# Patient Record
Sex: Male | Born: 1965 | Race: Black or African American | Hispanic: No | Smoking: Current every day smoker
Health system: Southern US, Community
[De-identification: ages and names within clinical notes are randomized; demographics above are authoritative.]

## PROBLEM LIST (undated history)

## (undated) DIAGNOSIS — R251 Tremor, unspecified: Secondary | ICD-10-CM

## (undated) DIAGNOSIS — R0600 Dyspnea, unspecified: Secondary | ICD-10-CM

## (undated) DIAGNOSIS — R569 Unspecified convulsions: Secondary | ICD-10-CM

## (undated) DIAGNOSIS — Q2730 Arteriovenous malformation, site unspecified: Secondary | ICD-10-CM

## (undated) DIAGNOSIS — I10 Essential (primary) hypertension: Secondary | ICD-10-CM

## (undated) DIAGNOSIS — R188 Other ascites: Secondary | ICD-10-CM

## (undated) DIAGNOSIS — K219 Gastro-esophageal reflux disease without esophagitis: Secondary | ICD-10-CM

## (undated) DIAGNOSIS — M21371 Foot drop, right foot: Secondary | ICD-10-CM

## (undated) DIAGNOSIS — M1990 Unspecified osteoarthritis, unspecified site: Secondary | ICD-10-CM

## (undated) DIAGNOSIS — J449 Chronic obstructive pulmonary disease, unspecified: Secondary | ICD-10-CM

## (undated) DIAGNOSIS — Z9359 Other cystostomy status: Secondary | ICD-10-CM

## (undated) HISTORY — PX: SUPRAPUBIC CATHETER INSERTION: SUR719

## (undated) HISTORY — PX: BRAIN SURGERY: SHX531

## (undated) HISTORY — PX: HERNIA REPAIR: SHX51

## (undated) HISTORY — PX: LEG SURGERY: SHX1003

---

## 2014-11-05 ENCOUNTER — Ambulatory Visit: Payer: Self-pay | Admitting: Oncology

## 2014-11-14 ENCOUNTER — Inpatient Hospital Stay: Payer: Self-pay | Admitting: Internal Medicine

## 2014-11-14 LAB — COMPREHENSIVE METABOLIC PANEL
ALBUMIN: 4.3 g/dL (ref 3.4–5.0)
ALK PHOS: 111 U/L
ANION GAP: 14 (ref 7–16)
Albumin: 4.2 g/dL (ref 3.4–5.0)
Alkaline Phosphatase: 108 U/L
Anion Gap: 18 — ABNORMAL HIGH (ref 7–16)
BUN: 112 mg/dL — AB (ref 7–18)
BUN: 118 mg/dL — ABNORMAL HIGH (ref 7–18)
Bilirubin,Total: 1 mg/dL (ref 0.2–1.0)
Bilirubin,Total: 0.8 mg/dL (ref 0.2–1.0)
CALCIUM: 9.9 mg/dL (ref 8.5–10.1)
CHLORIDE: 92 mmol/L — AB (ref 98–107)
CO2: 21 mmol/L (ref 21–32)
CREATININE: 13.62 mg/dL — AB (ref 0.60–1.30)
Calcium, Total: 8.8 mg/dL (ref 8.5–10.1)
Chloride: 91 mmol/L — ABNORMAL LOW (ref 98–107)
Co2: 14 mmol/L — ABNORMAL LOW (ref 21–32)
Creatinine: 14.61 mg/dL — ABNORMAL HIGH (ref 0.60–1.30)
EGFR (African American): 5 — ABNORMAL LOW
EGFR (African American): 5 — ABNORMAL LOW
EGFR (Non-African Amer.): 4 — ABNORMAL LOW
EGFR (Non-African Amer.): 4 — ABNORMAL LOW
Glucose: 122 mg/dL — ABNORMAL HIGH (ref 65–99)
Glucose: 95 mg/dL (ref 65–99)
OSMOLALITY: 291 (ref 275–301)
Osmolality: 286 (ref 275–301)
POTASSIUM: 5.8 mmol/L — AB (ref 3.5–5.1)
Potassium: 6.3 mmol/L — ABNORMAL HIGH (ref 3.5–5.1)
SGOT(AST): 24 U/L (ref 15–37)
SGOT(AST): 24 U/L (ref 15–37)
SGPT (ALT): 31 U/L
SGPT (ALT): 28 U/L
SODIUM: 126 mmol/L — AB (ref 136–145)
Sodium: 124 mmol/L — ABNORMAL LOW (ref 136–145)
Total Protein: 9.6 g/dL — ABNORMAL HIGH (ref 6.4–8.2)
Total Protein: 9.7 g/dL — ABNORMAL HIGH (ref 6.4–8.2)

## 2014-11-14 LAB — URINALYSIS, COMPLETE
BACTERIA: NONE SEEN
BLOOD: NEGATIVE
Bilirubin,UR: NEGATIVE
GLUCOSE, UR: NEGATIVE mg/dL (ref 0–75)
Ketone: NEGATIVE
LEUKOCYTE ESTERASE: NEGATIVE
NITRITE: NEGATIVE
PROTEIN: NEGATIVE
Ph: 5 (ref 4.5–8.0)
RBC,UR: NONE SEEN /HPF (ref 0–5)
SPECIFIC GRAVITY: 1.017 (ref 1.003–1.030)
Squamous Epithelial: NONE SEEN
WBC UR: <1 /HPF (ref 0–5)

## 2014-11-14 LAB — PHOSPHORUS: PHOSPHORUS: 10.5 mg/dL — AB (ref 2.5–4.9)

## 2014-11-14 LAB — BODY FLUID CELL COUNT WITH DIFFERENTIAL
BASOS ABS: 0 %
EOS PCT: 0 %
LYMPHS PCT: 4 %
NEUTROS PCT: 3 %
Nucleated Cell Count: 128 /mm3
Other Cells BF: 0 %
Other Mononuclear Cells: 93 %

## 2014-11-14 LAB — APTT: Activated PTT: 23.6 secs (ref 23.6–35.9)

## 2014-11-14 LAB — LIPASE, BLOOD: LIPASE: 185 U/L (ref 73–393)

## 2014-11-14 LAB — CBC
HCT: 52.7 % — ABNORMAL HIGH (ref 40.0–52.0)
HGB: 17.3 g/dL (ref 13.0–18.0)
MCH: 30 pg (ref 26.0–34.0)
MCHC: 32.8 g/dL (ref 32.0–36.0)
MCV: 91 fL (ref 80–100)
Platelet: 308 10*3/uL (ref 150–440)
RBC: 5.77 10*6/uL (ref 4.40–5.90)
RDW: 14.4 % (ref 11.5–14.5)
WBC: 9.2 10*3/uL (ref 3.8–10.6)

## 2014-11-14 LAB — PROTIME-INR
INR: 1.2
Prothrombin Time: 14.7 secs (ref 11.5–14.7)

## 2014-11-14 LAB — PROTEIN / CREATININE RATIO, URINE
CREATININE, URINE: 235.7 mg/dL — AB (ref 30.0–125.0)
Protein, Random Urine: 11 mg/dL (ref 0–12)
Protein/Creat. Ratio: 47 mg/gCREAT (ref 0–200)

## 2014-11-14 LAB — TROPONIN I: Troponin-I: <0.02 ng/mL

## 2014-11-14 LAB — ALBUMIN, FLUID (OTHER)

## 2014-11-14 LAB — AMMONIA: AMMONIA, PLASMA: 35 umol/L — AB (ref 11–32)

## 2014-11-15 DIAGNOSIS — I517 Cardiomegaly: Secondary | ICD-10-CM

## 2014-11-15 LAB — CBC WITH DIFFERENTIAL/PLATELET
BASOS PCT: 0.1 %
Basophil #: 0 10*3/uL (ref 0.0–0.1)
Eosinophil #: 0 10*3/uL (ref 0.0–0.7)
Eosinophil %: 0.4 %
HCT: 41.4 % (ref 40.0–52.0)
HGB: 14.1 g/dL (ref 13.0–18.0)
Lymphocyte #: 0.3 10*3/uL — ABNORMAL LOW (ref 1.0–3.6)
Lymphocyte %: 3.6 %
MCH: 30.2 pg (ref 26.0–34.0)
MCHC: 34.1 g/dL (ref 32.0–36.0)
MCV: 89 fL (ref 80–100)
Monocyte #: 0.8 x10 3/mm (ref 0.2–1.0)
Monocyte %: 9.8 %
NEUTROS ABS: 6.7 10*3/uL — AB (ref 1.4–6.5)
Neutrophil %: 86.1 %
Platelet: 185 10*3/uL (ref 150–440)
RBC: 4.66 10*6/uL (ref 4.40–5.90)
RDW: 14.3 % (ref 11.5–14.5)
WBC: 7.8 10*3/uL (ref 3.8–10.6)

## 2014-11-15 LAB — TSH: Thyroid Stimulating Horm: 0.85 u[IU]/mL

## 2014-11-15 LAB — COMPREHENSIVE METABOLIC PANEL
ALK PHOS: 78 U/L
AST: 19 U/L (ref 15–37)
Albumin: 3.5 g/dL (ref 3.4–5.0)
Anion Gap: 7 (ref 7–16)
BILIRUBIN TOTAL: 0.5 mg/dL (ref 0.2–1.0)
BUN: 60 mg/dL — AB (ref 7–18)
CO2: 29 mmol/L (ref 21–32)
Calcium, Total: 8.5 mg/dL (ref 8.5–10.1)
Chloride: 96 mmol/L — ABNORMAL LOW (ref 98–107)
Creatinine: 3.81 mg/dL — ABNORMAL HIGH (ref 0.60–1.30)
EGFR (Non-African Amer.): 18 — ABNORMAL LOW
GFR CALC AF AMER: 22 — AB
Glucose: 103 mg/dL — ABNORMAL HIGH (ref 65–99)
Osmolality: 282 (ref 275–301)
Potassium: 4 mmol/L (ref 3.5–5.1)
SGPT (ALT): 20 U/L
Sodium: 132 mmol/L — ABNORMAL LOW (ref 136–145)
Total Protein: 7.5 g/dL (ref 6.4–8.2)

## 2014-11-15 LAB — LIPASE, BLOOD: Lipase: 123 U/L (ref 73–393)

## 2014-11-15 LAB — HEMOGLOBIN A1C: HEMOGLOBIN A1C: 5.4 % (ref 4.2–6.3)

## 2014-11-16 LAB — COMPREHENSIVE METABOLIC PANEL
ALK PHOS: 65 U/L
ANION GAP: 5 — AB (ref 7–16)
AST: 24 U/L (ref 15–37)
Albumin: 2.7 g/dL — ABNORMAL LOW (ref 3.4–5.0)
Albumin: 2.8 g/dL — ABNORMAL LOW (ref 3.4–5.0)
Alkaline Phosphatase: 60 U/L
Anion Gap: 5 — ABNORMAL LOW (ref 7–16)
BILIRUBIN TOTAL: 0.4 mg/dL (ref 0.2–1.0)
BUN: 16 mg/dL (ref 7–18)
BUN: 10 mg/dL (ref 7–18)
Bilirubin,Total: 0.4 mg/dL (ref 0.2–1.0)
CALCIUM: 8.2 mg/dL — AB (ref 8.5–10.1)
Calcium, Total: 8.1 mg/dL — ABNORMAL LOW (ref 8.5–10.1)
Chloride: 100 mmol/L (ref 98–107)
Chloride: 101 mmol/L (ref 98–107)
Co2: 32 mmol/L (ref 21–32)
Co2: 31 mmol/L (ref 21–32)
Creatinine: 0.93 mg/dL (ref 0.60–1.30)
Creatinine: 0.84 mg/dL (ref 0.60–1.30)
EGFR (African American): >60
EGFR (African American): >60
GLUCOSE: 91 mg/dL (ref 65–99)
Glucose: 99 mg/dL (ref 65–99)
OSMOLALITY: 275 (ref 275–301)
OSMOLALITY: 273 (ref 275–301)
Potassium: 4.1 mmol/L (ref 3.5–5.1)
Potassium: 4.5 mmol/L (ref 3.5–5.1)
SGOT(AST): 25 U/L (ref 15–37)
SGPT (ALT): 19 U/L
SGPT (ALT): 18 U/L
SODIUM: 137 mmol/L (ref 136–145)
SODIUM: 137 mmol/L (ref 136–145)
Total Protein: 5.9 g/dL — ABNORMAL LOW (ref 6.4–8.2)
Total Protein: 6.2 g/dL — ABNORMAL LOW (ref 6.4–8.2)

## 2014-11-16 LAB — CBC WITH DIFFERENTIAL/PLATELET
BASOS ABS: 0 10*3/uL (ref 0.0–0.1)
BASOS PCT: 0.4 %
Eosinophil #: 0 10*3/uL (ref 0.0–0.7)
Eosinophil %: 0.7 %
HCT: 34.8 % — ABNORMAL LOW (ref 40.0–52.0)
HGB: 11.4 g/dL — AB (ref 13.0–18.0)
LYMPHS PCT: 13.7 %
Lymphocyte #: 1 10*3/uL (ref 1.0–3.6)
MCH: 30.1 pg (ref 26.0–34.0)
MCHC: 32.9 g/dL (ref 32.0–36.0)
MCV: 91 fL (ref 80–100)
MONO ABS: 1.3 x10 3/mm — AB (ref 0.2–1.0)
Monocyte %: 17.8 %
Neutrophil #: 5 10*3/uL (ref 1.4–6.5)
Neutrophil %: 67.4 %
PLATELETS: 154 10*3/uL (ref 150–440)
RBC: 3.81 10*6/uL — ABNORMAL LOW (ref 4.40–5.90)
RDW: 13.9 % (ref 11.5–14.5)
WBC: 7.4 10*3/uL (ref 3.8–10.6)

## 2014-11-16 LAB — AFP TUMOR MARKER: AFP-Tumor Marker: 4.9 ng/mL (ref 0.0–8.3)

## 2014-11-16 LAB — AMMONIA: Ammonia, Plasma: 32 mcmol/L (ref 11–32)

## 2014-11-16 LAB — UR PROT ELECTROPHORESIS, URINE RANDOM

## 2014-11-17 LAB — CEA: CEA: 1.1 ng/mL (ref 0.0–4.7)

## 2014-11-17 LAB — CANCER ANTIGEN 19-9: CA 19 9: 4 U/mL (ref 0–35)

## 2014-11-18 LAB — CBC WITH DIFFERENTIAL/PLATELET
BASOS ABS: 0 10*3/uL (ref 0.0–0.1)
Basophil %: 0.1 %
Eosinophil #: 0.3 10*3/uL (ref 0.0–0.7)
Eosinophil %: 2 %
HCT: 39.2 % — AB (ref 40.0–52.0)
HGB: 12.8 g/dL — ABNORMAL LOW (ref 13.0–18.0)
LYMPHS ABS: 1.5 10*3/uL (ref 1.0–3.6)
LYMPHS PCT: 11.1 %
MCH: 29.8 pg (ref 26.0–34.0)
MCHC: 32.6 g/dL (ref 32.0–36.0)
MCV: 92 fL (ref 80–100)
MONOS PCT: 6.1 %
Monocyte #: 0.8 x10 3/mm (ref 0.2–1.0)
Neutrophil #: 11.1 10*3/uL — ABNORMAL HIGH (ref 1.4–6.5)
Neutrophil %: 80.7 %
PLATELETS: 227 10*3/uL (ref 150–440)
RBC: 4.28 10*6/uL — ABNORMAL LOW (ref 4.40–5.90)
RDW: 14.2 % (ref 11.5–14.5)
WBC: 13.8 10*3/uL — AB (ref 3.8–10.6)

## 2014-11-18 LAB — BASIC METABOLIC PANEL
Anion Gap: 7 (ref 7–16)
BUN: 30 mg/dL — ABNORMAL HIGH (ref 7–18)
CALCIUM: 8.9 mg/dL (ref 8.5–10.1)
Chloride: 99 mmol/L (ref 98–107)
Co2: 27 mmol/L (ref 21–32)
Creatinine: 2.53 mg/dL — ABNORMAL HIGH (ref 0.60–1.30)
EGFR (African American): 35 — ABNORMAL LOW
GFR CALC NON AF AMER: 29 — AB
GLUCOSE: 82 mg/dL (ref 65–99)
OSMOLALITY: 272 (ref 275–301)
POTASSIUM: 4.4 mmol/L (ref 3.5–5.1)
SODIUM: 133 mmol/L — AB (ref 136–145)

## 2014-11-18 LAB — KAPPA/LAMBDA FREE LIGHT CHAINS (ARMC)

## 2014-11-18 LAB — PROTEIN ELECTROPHORESIS(ARMC)

## 2014-11-18 LAB — BODY FLUID CULTURE

## 2014-11-22 ENCOUNTER — Inpatient Hospital Stay: Payer: Self-pay | Admitting: Internal Medicine

## 2014-11-22 LAB — URINALYSIS, COMPLETE
BILIRUBIN, UR: NEGATIVE
Glucose,UR: >=500 mg/dL (ref 0–75)
KETONE: NEGATIVE
Nitrite: NEGATIVE
PH: 6 (ref 4.5–8.0)
Protein: 100
RBC,UR: 13 /HPF (ref 0–5)
SQUAMOUS EPITHELIAL: NONE SEEN
Specific Gravity: 1.011 (ref 1.003–1.030)
WBC UR: 107 /HPF (ref 0–5)

## 2014-11-22 LAB — BODY FLUID CELL COUNT WITH DIFFERENTIAL
Basophil: 0 %
Eosinophil: 0 %
Lymphocytes: 1 %
NUCLEATED CELL COUNT: 995 /mm3
Neutrophils: 55 %
OTHER CELLS BF: 0 %
OTHER MONONUCLEAR CELLS: 44 %

## 2014-11-22 LAB — TROPONIN I

## 2014-11-22 LAB — DRUG SCREEN, URINE

## 2014-11-22 LAB — PROTEIN, BODY FLUID: PROTEIN, BODY FLUID: 0.6 g/dL

## 2014-11-22 LAB — CBC WITH DIFFERENTIAL/PLATELET
BASOS PCT: 0.3 %
Basophil #: 0 10*3/uL (ref 0.0–0.1)
EOS PCT: 0.1 %
Eosinophil #: 0 10*3/uL (ref 0.0–0.7)
HCT: 45.9 % (ref 40.0–52.0)
HGB: 15.4 g/dL (ref 13.0–18.0)
LYMPHS PCT: 3.3 %
Lymphocyte #: 0.5 10*3/uL — ABNORMAL LOW (ref 1.0–3.6)
MCH: 30.5 pg (ref 26.0–34.0)
MCHC: 33.5 g/dL (ref 32.0–36.0)
MCV: 91 fL (ref 80–100)
Monocyte #: 1.2 x10 3/mm — ABNORMAL HIGH (ref 0.2–1.0)
Monocyte %: 7.9 %
NEUTROS ABS: 13 10*3/uL — AB (ref 1.4–6.5)
NEUTROS PCT: 88.4 %
Platelet: 356 10*3/uL (ref 150–440)
RBC: 5.05 10*6/uL (ref 4.40–5.90)
RDW: 14.4 % (ref 11.5–14.5)
WBC: 14.7 10*3/uL — ABNORMAL HIGH (ref 3.8–10.6)

## 2014-11-22 LAB — LIPASE, BLOOD: Lipase: 225 U/L (ref 73–393)

## 2014-11-22 LAB — COMPREHENSIVE METABOLIC PANEL
ALK PHOS: 101 U/L
Albumin: 3.7 g/dL (ref 3.4–5.0)
Anion Gap: 16 (ref 7–16)
BILIRUBIN TOTAL: 0.6 mg/dL (ref 0.2–1.0)
BUN: 117 mg/dL — ABNORMAL HIGH (ref 7–18)
CALCIUM: 10.1 mg/dL (ref 8.5–10.1)
Chloride: 88 mmol/L — ABNORMAL LOW (ref 98–107)
Co2: 18 mmol/L — ABNORMAL LOW (ref 21–32)
Creatinine: 10.72 mg/dL — ABNORMAL HIGH (ref 0.60–1.30)
EGFR (African American): 7 — ABNORMAL LOW
GFR CALC NON AF AMER: 5 — AB
Glucose: 137 mg/dL — ABNORMAL HIGH (ref 65–99)
Osmolality: 285 (ref 275–301)
POTASSIUM: 5.9 mmol/L — AB (ref 3.5–5.1)
SGOT(AST): 21 U/L (ref 15–37)
SGPT (ALT): 22 U/L
SODIUM: 122 mmol/L — AB (ref 136–145)
Total Protein: 9.7 g/dL — ABNORMAL HIGH (ref 6.4–8.2)

## 2014-11-22 LAB — ALBUMIN, FLUID (OTHER)

## 2014-11-22 LAB — LACTATE DEHYDROGENASE, PLEURAL OR PERITONEAL FLUID: LDH, BODY FLUID: 68 U/L

## 2014-11-22 LAB — HEMOGLOBIN: HGB: 13.1 g/dL (ref 13.0–18.0)

## 2014-11-23 LAB — BASIC METABOLIC PANEL
Anion Gap: 10 (ref 7–16)
BUN: 60 mg/dL — AB (ref 7–18)
CREATININE: 3.78 mg/dL — AB (ref 0.60–1.30)
Calcium, Total: 8.7 mg/dL (ref 8.5–10.1)
Chloride: 97 mmol/L — ABNORMAL LOW (ref 98–107)
Co2: 24 mmol/L (ref 21–32)
EGFR (Non-African Amer.): 18 — ABNORMAL LOW
GFR CALC AF AMER: 22 — AB
Glucose: 123 mg/dL — ABNORMAL HIGH (ref 65–99)
Osmolality: 281 (ref 275–301)
Potassium: 4.7 mmol/L (ref 3.5–5.1)
SODIUM: 131 mmol/L — AB (ref 136–145)

## 2014-11-23 LAB — CBC WITH DIFFERENTIAL/PLATELET
BASOS ABS: 0 10*3/uL (ref 0.0–0.1)
Basophil %: 0.2 %
Eosinophil #: 0.1 10*3/uL (ref 0.0–0.7)
Eosinophil %: 0.7 %
HCT: 37.4 % — AB (ref 40.0–52.0)
HGB: 12.4 g/dL — AB (ref 13.0–18.0)
LYMPHS ABS: 0.6 10*3/uL — AB (ref 1.0–3.6)
Lymphocyte %: 8.2 %
MCH: 30.3 pg (ref 26.0–34.0)
MCHC: 33.2 g/dL (ref 32.0–36.0)
MCV: 91 fL (ref 80–100)
MONO ABS: 0.7 x10 3/mm (ref 0.2–1.0)
Monocyte %: 9.6 %
NEUTROS ABS: 6.2 10*3/uL (ref 1.4–6.5)
Neutrophil %: 81.3 %
Platelet: 318 10*3/uL (ref 150–440)
RBC: 4.1 10*6/uL — ABNORMAL LOW (ref 4.40–5.90)
RDW: 14.1 % (ref 11.5–14.5)
WBC: 7.7 10*3/uL (ref 3.8–10.6)

## 2014-11-23 LAB — COMPREHENSIVE METABOLIC PANEL
ALBUMIN: 2.5 g/dL — AB (ref 3.4–5.0)
ANION GAP: 7 (ref 7–16)
AST: 19 U/L (ref 15–37)
Alkaline Phosphatase: 70 U/L
BUN: 38 mg/dL — ABNORMAL HIGH (ref 7–18)
Bilirubin,Total: 0.7 mg/dL (ref 0.2–1.0)
Calcium, Total: 8.5 mg/dL (ref 8.5–10.1)
Chloride: 99 mmol/L (ref 98–107)
Co2: 25 mmol/L (ref 21–32)
Creatinine: 2.19 mg/dL — ABNORMAL HIGH (ref 0.60–1.30)
EGFR (African American): 41 — ABNORMAL LOW
EGFR (Non-African Amer.): 34 — ABNORMAL LOW
Glucose: 133 mg/dL — ABNORMAL HIGH (ref 65–99)
Osmolality: 274 (ref 275–301)
POTASSIUM: 4.3 mmol/L (ref 3.5–5.1)
SGPT (ALT): 16 U/L
Sodium: 131 mmol/L — ABNORMAL LOW (ref 136–145)
Total Protein: 7 g/dL (ref 6.4–8.2)

## 2014-11-23 LAB — AMMONIA: Ammonia, Plasma: 20 mcmol/L (ref 11–32)

## 2014-11-23 LAB — MAGNESIUM: MAGNESIUM: 2.1 mg/dL

## 2014-11-23 LAB — PROTIME-INR
INR: 1.2
Prothrombin Time: 14.9 secs — ABNORMAL HIGH (ref 11.5–14.7)

## 2014-11-23 LAB — APTT: Activated PTT: 35 secs (ref 23.6–35.9)

## 2014-11-24 LAB — BASIC METABOLIC PANEL
Anion Gap: 5 — ABNORMAL LOW (ref 7–16)
BUN: 6 mg/dL — ABNORMAL LOW (ref 7–18)
CHLORIDE: 101 mmol/L (ref 98–107)
Calcium, Total: 8.4 mg/dL — ABNORMAL LOW (ref 8.5–10.1)
Co2: 30 mmol/L (ref 21–32)
Creatinine: 0.8 mg/dL (ref 0.60–1.30)
EGFR (African American): >60
EGFR (Non-African Amer.): >60
Glucose: 76 mg/dL (ref 65–99)
OSMOLALITY: 268 (ref 275–301)
POTASSIUM: 3.6 mmol/L (ref 3.5–5.1)
Sodium: 136 mmol/L (ref 136–145)

## 2014-11-24 LAB — CBC WITH DIFFERENTIAL/PLATELET
BASOS PCT: 1 %
Basophil #: 0 10*3/uL (ref 0.0–0.1)
EOS PCT: 3.3 %
Eosinophil #: 0.1 10*3/uL (ref 0.0–0.7)
HCT: 32 % — AB (ref 40.0–52.0)
HGB: 10.6 g/dL — AB (ref 13.0–18.0)
LYMPHS ABS: 0.6 10*3/uL — AB (ref 1.0–3.6)
Lymphocyte %: 17.4 %
MCH: 30.3 pg (ref 26.0–34.0)
MCHC: 33.1 g/dL (ref 32.0–36.0)
MCV: 92 fL (ref 80–100)
MONO ABS: 0.5 x10 3/mm (ref 0.2–1.0)
Monocyte %: 16 %
NEUTROS ABS: 2.1 10*3/uL (ref 1.4–6.5)
Neutrophil %: 62.3 %
Platelet: 257 10*3/uL (ref 150–440)
RBC: 3.5 10*6/uL — ABNORMAL LOW (ref 4.40–5.90)
RDW: 13.9 % (ref 11.5–14.5)
WBC: 3.3 10*3/uL — ABNORMAL LOW (ref 3.8–10.6)

## 2014-11-26 LAB — BODY FLUID CULTURE

## 2014-11-27 ENCOUNTER — Emergency Department: Payer: Self-pay | Admitting: Emergency Medicine

## 2014-11-27 LAB — COMPREHENSIVE METABOLIC PANEL
ALBUMIN: 3.8 g/dL (ref 3.4–5.0)
ALK PHOS: 79 U/L
ALT: 21 U/L
ANION GAP: 8 (ref 7–16)
BUN: 3 mg/dL — ABNORMAL LOW (ref 7–18)
Bilirubin,Total: 0.2 mg/dL (ref 0.2–1.0)
CALCIUM: 9.1 mg/dL (ref 8.5–10.1)
Chloride: 103 mmol/L (ref 98–107)
Co2: 27 mmol/L (ref 21–32)
Creatinine: 0.95 mg/dL (ref 0.60–1.30)
EGFR (African American): >60
EGFR (Non-African Amer.): >60
Glucose: 97 mg/dL (ref 65–99)
Osmolality: 272 (ref 275–301)
Potassium: 4.3 mmol/L (ref 3.5–5.1)
SGOT(AST): 21 U/L (ref 15–37)
Sodium: 138 mmol/L (ref 136–145)
TOTAL PROTEIN: 8 g/dL (ref 6.4–8.2)

## 2014-11-27 LAB — DRUG SCREEN, URINE

## 2014-11-27 LAB — CBC
HCT: 40.9 % (ref 40.0–52.0)
HGB: 13.2 g/dL (ref 13.0–18.0)
MCH: 29.6 pg (ref 26.0–34.0)
MCHC: 32.3 g/dL (ref 32.0–36.0)
MCV: 92 fL (ref 80–100)
Platelet: 316 10*3/uL (ref 150–440)
RBC: 4.47 10*6/uL (ref 4.40–5.90)
RDW: 14 % (ref 11.5–14.5)
WBC: 6.2 10*3/uL (ref 3.8–10.6)

## 2014-11-27 LAB — ETHANOL: Ethanol: <3 mg/dL

## 2014-11-27 LAB — ACETAMINOPHEN LEVEL

## 2014-11-27 LAB — SALICYLATE LEVEL: Salicylates, Serum: <1.7 mg/dL

## 2014-11-27 LAB — TSH: Thyroid Stimulating Horm: 1.39 u[IU]/mL

## 2014-12-06 ENCOUNTER — Ambulatory Visit: Payer: Self-pay | Admitting: Oncology

## 2014-12-17 ENCOUNTER — Emergency Department: Payer: Self-pay | Admitting: Emergency Medicine

## 2014-12-23 ENCOUNTER — Ambulatory Visit: Payer: Self-pay | Admitting: Urology

## 2014-12-28 ENCOUNTER — Ambulatory Visit: Payer: Self-pay | Admitting: Urology

## 2015-01-14 ENCOUNTER — Emergency Department: Payer: Self-pay | Admitting: Emergency Medicine

## 2015-03-06 NOTE — Consult Note (Signed)
Pt asleep, awoke for questions, denies abd pain but is tender on palpation.  No abd distention, VSS afebrile, hgb 11, albumin 2.7.  Hep B studies neg.  Consider repeat CT, consider laproscopic exam when improved.  Pt denies moonshine consumption.  Electronic Signatures: Scot JunElliott, Renaldo Gornick T (MD)  (Signed on 12-Jan-16 12:03)  Authored  Last Updated: 12-Jan-16 12:03 by Scot JunElliott, Tametra Ahart T (MD)

## 2015-03-06 NOTE — Consult Note (Signed)
Pt extubated and talking, says he will cut back on alcohol. Does have discomfort in RUQ and epigastric area.  His albumin on admission was normal but he was also in renal failure, dehydration.  Repeat LFT's tomorrow, await other labs assoc with metastatic cancer.  Not beligerent now.  Waiting on stool studies.  Electronic Signatures: Scot JunElliott, Kateri Balch T (MD)  (Signed on 11-Jan-16 17:00)  Authored  Last Updated: 11-Jan-16 17:00 by Scot JunElliott, Adriana Lina T (MD)
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
## 2015-03-06 NOTE — H&P (Signed)
PATIENT NAME:  Joanie CoddingtonHORPE, Ricci MR#:  [id number] DATE OF BIRTH:  07/06/1966  PRIMARY CARE PROVIDER: None.   CHIEF COMPLAINT: Abdominal pain and distention.   HISTORY OF PRESENTING ILLNESS: A 49 year old African American male patient with past medical history of recurrent ascites, COPD, presents to the Emergency Room complaining of worsening abdominal pain and distention. The patient was recently in the hospital from 11/14/2014-11/18/2014 for ascites, renal failure. The patient was admitted on 11/14/2014 with abdominal pain, distention, also with encephalopathy with severe agitation, started on Precedex drip, later sedated and had to be intubated. The patient did receive 1 round of dialysis, after which he started making good amount of urine, but later he had urinary retention and refused a Foley. In the meantime, patient did get paracentesis with 6 liters of fluid removed which was  transudate. Cytology is not available at this time. He was also seen by oncology who suggested maybe getting biopsy since he had peritoneal carcinomatosis with ascites. Echocardiogram was normal.   On reviewing his ANA results, ANA was positive with anti-SSA positive.   PAST MEDICAL HISTORY: 1.  Recurrent ascites.  2.  COPD.   PAST SURGICAL HISTORY:  Brain surgery in 1998.   ALLERGIES: ADVIL.   HOME MEDICATIONS:  None.   FAMILY HISTORY: Father with diabetes and heart disease.   SOCIAL HISTORY: The patient lives alone. He smokes 1/2 pack a day. He does smoke marijuana. States only occasional alcohol usage.   REVIEW OF SYSTEMS: Unobtainable secondary to patient's encephalopathy.   PHYSICAL EXAMINATION: VITAL SIGNS:  Shows temperature 97.4, pulse of 112, blood pressure 156/129, saturating 98% on 2 liters oxygen.  GENERAL: African American male patient sitting up in bed, drowsy.  PSYCHIATRIC: Drowsy.  HEENT: Atraumatic, normocephalic. Oral mucosa dry and pink. External ears and nose normal. No pallor. No  icterus. Pupils bilaterally equal and reactive to light.  NECK: Supple. No thyromegaly. No palpable lymph nodes. Trachea midline. No carotid bruit or JVD.  CARDIOVASCULAR: S1, S2 tachycardic without any murmurs. Peripheral pulses 2+. No edema. RESPIRATORY: Has decreased air entry on the right side.   GASTROINTESTINAL: Has distended tense abdomen, tender diffusely.  GENITOURINARY:  No CVA tenderness or bladder distention.  SKIN:  Warm and dry. No petechiae, no rash, or ulcers.  MUSCULOSKELETAL:  No joint swelling, redness, effusion of the large joints. Normal muscle tone.  NEUROLOGIC: Motor strength 5/5 in upper and lower extremities.  LYMPHATIC: No cervical lymphadenopathy.   LABORATORY STUDIES: Show glucose of 137, BUN 117, creatinine 10.72, , potassium 5.9, chloride 88, bicarbonate 18, lipase 225. AST, ALT, alkaline phosphatase, bilirubin, and albumin normal. Troponin less than 0.02. WBC 14.7, hemoglobin 15.4, with platelets of 356,000.   Urinalysis shows 1+ bacteria and 107 WBC with no epithelial cells.   IMAGING:  CT scan of the abdomen and pelvis without contrast shows significant increase in ascites. Also increase in his right pleural effusion, right lower lobe consolidation.   EKG shows sinus tachycardia.   ASSESSMENT AND PLAN: 1.  Acute renal failure, likely from urinary obstruction. The patient had 900 mL of urine on bladder scan, initially refused Foley, but on further counseling and giving him Versed he has agreed. Presently, he has good urine output. I have discussed with Dr. Thedore MinsSingh of nephrology who has seen the patient. Continue intravenous fluids and monitor input and output closely. If there is any worsening he will need dialysis. The patient did have dialysis recently a week prior, only 1 round during recent admission. His potassium  should improve as his acute renal failure improves.  2.  Recurrent ascites. It is unclear what is the cause for his recurrent ascites. The patient  has had a paracentesis done previously in [place] and [place]. On his recent admission, this fluid was transudate; cytology is not available. Both gastroenterology and oncology had seen the patient. Dr. Orlie Dakin did suggest biopsy due to possibility for abdominal carcinomatosis. We will consult oncology again.  3.  Gastrointestinal bleed. The patient had 150 mL of dark red vomiting here in the Emergency Room. We will start him on Protonix drip, get serial hemoglobins, consult gastroenterology for consideration of an endoscopy. Could be esophageal varices or peptic ulcer disease.  4.  Right lower lobe consolidation, infiltrate. We will start him on antibiotics with vancomycin,  Zosyn, and azithromycin to cover for healthcare-acquired pneumonia.  5.  Positive ANA with positive anti-SSA. The patient does not seem to have any rheumatologic problems at this point, but will consult rheumatology to see if there is any further relation to his acute condition with these laboratory studies.  6.  Deep vein thrombosis prophylaxis with sequential compression devices.   CODE STATUS: Full code.   Critical care time spent today on this patient's care was 65 minutes.   ____________________________ Molinda Bailiff Rollande Thursby, MD srs:LT D: 11/22/2014 16:04:06 ET T: 11/22/2014 16:23:39 ET JOB#: [id number]  cc: Wardell Heath R. Kathya Wilz, MD, <Dictator> Orie Fisherman MD ELECTRONICALLY SIGNED 12/01/2014 16:55

## 2015-03-06 NOTE — Op Note (Signed)
PATIENT NAME:  Corey CoddingtonHORPE, Hawkins MR#:  [id number] DATE OF BIRTH:  [birth year]  DATE OF PROCEDURE:  11/14/2014  PREOPERATIVE DIAGNOSES:  1.  Acute renal failure.  2.  Poor venous access.   POSTOPERATIVE DIAGNOSES: 1.  Acute renal failure. 2.  Poor venous access.  PROCEDURES:  1.  Ultrasound guidance for vascular access, right jugular vein.  2.  Placement of right jugular triple-lumen catheter.   SURGEON: Annice NeedyJason S. Shalona Harbour, MD   ANESTHESIA: Local.   ESTIMATED BLOOD LOSS: Minimal.   INDICATION FOR PROCEDURE: A 49 year old gentleman who was admitted with acute renal failure. He is intubated and sedated on the ventilator. He has poor venous access. I have already placed a dialysis catheter, which is dictated separately, and I am asked to place a central line.   DESCRIPTION OF PROCEDURE: The patient is laid flat in the critical care bed. The right neck was sterilely prepped and draped and a sterile surgical field was created. The jugular vein was visualized and found to be widely patent. It was accessed under direct ultrasound guidance without difficulty with a Seldinger needle. A J-wire was placed after skin nick and dilatation. The triple lumen catheter was placed over the wire and the wire was removed. All 3 lumens withdrew blood, which was dark red and nonpulsatile and flushed easily with sterile saline. It was secured to the skin at about 18 cm with 3 silk sutures. Sterile dressing was placed. Stat chest x-ray showed the catheter tip at the cavoatrial junction in excellent location.    ____________________________ Annice NeedyJason S. Aldine Chakraborty, MD jsd:TT D: 11/14/2014 15:07:23 ET T: 11/14/2014 19:45:29 ET JOB#: [id number]  cc: Annice NeedyJason S. Tereso Unangst, MD, <Dictator> Annice NeedyJASON S Armen Waring MD ELECTRONICALLY SIGNED 11/16/2014 13:39
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
## 2015-03-06 NOTE — Consult Note (Signed)
Brief Consult Note: Patient was seen by consultant.   Consult note dictated.   Comments: pos FANA, Pos SSA abs(Neg SSB abs). But little to suggest Sjogrens. no sicca,xerostomia,parotid swelling,no  gammopathy. Sjogrens can be associated with Primary Biliary cirrhosis and Autoimmune Hepatitis (not ascites), but there is no evidence of  this. not enough clinical suspicion to recommend salivary gland biopsy.  Electronic Signatures: Royann ShiversKernodle, Jr., Helen HashimotoGeorge Wallace (MD)  (Signed 19-Jan-16 19:04)  Authored: Brief Consult Note   Last Updated: 19-Jan-16 19:04 by Royann ShiversKernodle, Jr., Helen HashimotoGeorge Wallace (MD)

## 2015-03-06 NOTE — Consult Note (Signed)
CT scan with contrast results noted with no obvious evidence of malignancy. All tumor markers including SIEP. UIEP, AFP, CEA, and CA-19-9 are all negative.  No oncology intervention needed at this time. Would continue to pursue non-malignant etiologies of patient's underlying acute problems.  Appreciate consult, call with questions.  Electronic Signatures: Gerarda FractionFinnegan, Timothy (MD)  (Signed on 13-Jan-16 08:37)  Authored  Last Updated: 13-Jan-16 08:37 by Gerarda FractionFinnegan, Timothy (MD)
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
## 2015-03-06 NOTE — Consult Note (Signed)
PATIENT NAME:  Corey Cortez, Corey Cortez MR#:  [id number] DATE OF BIRTH:  May 18, 1966  DATE OF CONSULTATION:  11/22/2014  REFERRING PHYSICIAN:  Srikar R. Sudini, MD CONSULTING PHYSICIAN:  Arther Dames, MD  REASON FOR CONSULTATION: Hematemesis.   HISTORY OF PRESENT ILLNESS: Mr. Corey Cortez is a 49 year old male with a past medical history notable for recurrent ascites, COPD, alcohol use who has presented to the Emergency Room for evaluation of abdominal distention and increased abdominal swelling. Of note, Mr. Corey Cortez has a long-standing history of recurrent ascites. He reports that it has been worked up at [place] without any etiology determined. He was also here at [facility] from January 10th through January 14th for ascites and renal failure. He did have a large volume paracentesis during that hospital admission. He also had a CT scan during that admission, which raised the possibility of omental caking, although on his latest CT this admission there is no evidence of omental caking.   On presentation here today, Mr. Corey Cortez was also noted to be in severe renal failure with a creatinine of 10.   He did continue to drink alcohol until earlier in January of this year. He reports his last alcohol drink was January 6th of this year.   In the Emergency Room, Mr. Corey Cortez was noted to throw up about a 100 mL of dark material suggestive of coffee-ground emesis. Mr. Corey Cortez reports he has not previously thrown up any dark material blood to this degree. He also has not had any trouble with rectal bleeding or melena recently. He did have an episode 3 years ago he reports of rectal bleeding and melena. He is unsure the workup. He does report having an EGD approximately in 2014 at [facility] for rectal bleeding.   PAST MEDICAL HISTORY:  1.  Recurrent ascites.  2.  COPD.  3.  History of EtOH abuse.   PAST SURGICAL HISTORY: He had a brain surgery in 1998.   ALLERGIES: ADVIL.   HOME MEDICATIONS: None.   FAMILY  HISTORY: No family history of GI malignancy.   SOCIAL HISTORY: He is an ongoing smoker. He quit alcohol on January 6th he reports.   REVIEW OF SYSTEMS: A 10 system review was conducted and is negative except as stated in the HPI.   PHYSICAL EXAMINATION:  VITAL SIGNS: Temperature is 98.5, pulse is 90, respirations are 20, blood pressure 140/97, pulse oximetry is 92% on room.   GENERAL: Alert and oriented x 4.  No acute distress. Appears stated age. HEENT: Normocephalic/atraumatic. Extraocular movements are intact. Anicteric. NECK: Soft, supple. JVP appears normal. No adenopathy. CHEST: Clear to auscultation. No wheeze or crackle. Respirations unlabored. HEART: Regular. No murmur, rub, or gallop.  Normal S1 and S2. ABDOMEN: Soft. Normal active bowel sounds in all 4 quadrants.  No organomegaly. No masses. Positive for significant tenderness to palpation. Also, positive for abdominal distention. Positive for paracentesis site in the right lower quadrant. EXTREMITIES: No swelling, well perfused. SKIN: No rash or lesion. Skin color, texture, turgor normal. NEUROLOGICAL: Grossly intact. PSYCHIATRIC: Normal tone and affect. MUSCULOSKELETAL: No joint swelling or erythema.   LABORATORY DATA: His sodium is 122, potassium 5.9, BUN 117, creatinine 10.7. Lipase is 225. Liver enzymes: Total protein is 9.7, otherwise liver enzymes are normal. Albumin 3.7, total bili 0.6, alk phos 101, AST 21, ALT 22. Troponin is normal. White count is 14.7 hemoglobin is 15, hematocrit is 46.   CT abdomen and pelvis showed significant increase in the degree of ascites, increase in  degree of right-sided pleural effusion and right lower lobe consolidation; otherwise, no additional findings on the CT scan.   ASSESSMENT AND PLAN:  1.  Hematemesis: His hemoglobin is normal at this time. It does sound as though it is a relatively small amount of hematemesis. However, given his history of heavy alcohol use and the ascites and last  EGD in 2014, it would be warranted to rule out a cause for the upper gastrointestinal bleed such as ulcer, esophageal varices. Based on the workup to date, it does not appear that he does have cirrhosis. However, it has not been completely ruled out. Recommendations: A.  We will plan for EGD once renal function and electrolytes are more stable, likely not on Tuesday.  B.  We will continue to follow hemoglobin until stable.  C.  Okay to discontinue octreotide and Protonix drip and change to Protonix IV q. 12.  D.  Further recommendations pending findings on EGD.  2.   Recurrent ascites: It appears the omental caking seen on previous CT has resolved. I would recommend sending the ascites today for large volume cytology. I would also recommend checking a nucleated cell count to look for evidence of spontaneous bacterial peritonitis since he does have significant abdominal tenderness and an elevated white count along with renal failure.   We will follow. Thank you for these for this consult.    ____________________________ Arther Dames, MD mr:TT D: 11/22/2014 18:06:01 ET T: 11/22/2014 19:23:03 ET JOB#: [id number]  cc: Arther Dames, MD, <Dictator> Mellody Life MD ELECTRONICALLY SIGNED 12/20/2014 15:12

## 2015-03-06 NOTE — Consult Note (Signed)
CT scan with contrast from 11/16/14 noted with no obvious evidence of malignancy. Previously, all tumor markers including SIEP. UIEP, AFP, CEA, and CA-19-9 were all negative.  Cytology from previous paracentesis was also negative for malignancy. Recommend repeat paracentesis and send for cytology again. If still concerned for malignant etiology, would consult surgery for laparoscopy and biopsy. continue to pursue non-malignant etiologies of patient's underlying acute problems. consult to follow.  Electronic Signatures: Gerarda FractionFinnegan, Malayja Freund (MD)  (Signed on 18-Jan-16 19:54)  Authored  Last Updated: 18-Jan-16 19:54 by Gerarda FractionFinnegan, William Laske (MD)

## 2015-03-06 NOTE — Consult Note (Signed)
Pt seen in consultation with a male relative who provided all of the historical story but i cannot dictate the note due to system currently down.  Pt with hx of ETOH abuse, previous brain tumor surgery several years ago, presented with signif ascites and had 6 liters drained off abd with fluid studies not showing peritonitis.  He had CT showing scattered abnormalities in abd possible metastatic cancer, or diffuse unknown non malignant inflammation.  He was extremely beligerent and difficult to care for eariler today and is now sedated on Precedex.  is currently in renal failure.Black male asleep, head atraumatic, chest clear ant field, abd active bowel sounds, no palpable mass, no HSM. c/w renal failure. 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  Pt would be very difficult to dialyze due to his reactions to attempted health care offered services.  No specific management recommendations at this time.  With next blood draw would get CA19-9 to check for metastatic pancreatic cancer.  Prognosis grim.  Electronic Signatures: Scot JunElliott, Robert T (MD)  (Signed on 10-Jan-16 13:04)  Authored  Last Updated: 10-Jan-16 13:04 by Scot JunElliott, Robert T (MD)

## 2015-03-06 NOTE — Op Note (Signed)
PATIENT NAME:  Corey Cortez, Corey Cortez MR#:  [id number] DATE OF BIRTH:  [birth year]  DATE OF PROCEDURE:  11/14/2014  PREOPERATIVE DIAGNOSIS:  Acute on-chronic renal failure with need for dialysis.  POSTOPERATIVE DIAGNOSIS:  Acute on chronic renal failure with need for dialysis.  PROCEDURES:  1. Ultrasound guidance for vascular access to the right femoral vein.  2. Placement of non-tunneled dialysis catheter into the right femoral vein.  SURGEON: Annice NeedyJason S. Naleah Kofoed, MD  ANESTHESIA: Local.   BLOOD LOSS: Minimal.   INDICATION FOR PROCEDURE: A 49 year old African American gentleman who was admitted today with acute on chronic renal failure. His BUN is greater than 100, his potassium is nearly 6, and he needs immediate dialysis. We are asked to place a dialysis catheter. Risks and benefits were discussed. Informed consent was obtained.   DESCRIPTION OF THE PROCEDURE: The patient's right groin was sterilely prepped and draped and a sterile surgical field was created. The right femoral vein was visualized with ultrasound and found to be patent. It was then accessed under direct ultrasound guidance without difficulty with a Seldinger needle and a permanent image was recorded. A J-wire was placed after skin nick and dilatation. The 30 cm DuoGlide dialysis catheter was then placed over the wire and the wire was removed. All lumens withdrew blood well and flushed easily with sterile saline. It was secured to the skin with 3 nylon sutures. A sterile dressing was placed. The patient tolerated the procedure well and remained in his bed in a stable condition.   ____________________________ Annice NeedyJason S. Malayiah Mcbrayer, MD jsd:sb D: 11/15/2014 09:52:00 ET T: 11/15/2014 10:22:22 ET JOB#: [id number]  cc: Annice NeedyJason S. Eliot Bencivenga, MD, <Dictator> Annice NeedyJASON S Chyna Kneece MD ELECTRONICALLY SIGNED 11/16/2014 13:39

## 2015-03-06 NOTE — Consult Note (Signed)
PATIENT NAME:  Corey Cortez, Corey Cortez MR#:  [id number] DATE OF BIRTH:  October 30, 1966  DATE OF CONSULTATION:  11/27/2014  REFERRING PHYSICIAN:   CONSULTING PHYSICIAN:  Regan Llorente K. Guss Bundehalla, MD  PLACE OF DICTATION: [facility], [place].  AGE: 49 years.  SEX: Male.  RACE: African American.  SUBJECTIVE: The patient was brought because of behavior that his not like himself according to the family. The patient reported that he has been living at a boarding home and he was recently discharged [facility] medical floor after being stabilized for his liver problems and liver illness, and he reports "I almost died." The patient had status post brain surgery for status post TBI, but the patient thinks it is status post procedure problems.   PAST PSYCHIATRIC HISTORY: According to family, the patient has been psychotic and was probably being followed for schizophrenia and is not sure about the same. Family reports that whenever he takes antibiotics he becomes psychotic, which needs to be checked out. According to the patient and family they could not get pain pills from [employer] last Friday after discharge from this facility (that was on 11/26/2014). The patient reports that other members in the boarding home that were white people and they were all red necks and they were trying to take pills from him and they were looking on the floor for cocaine/crack.   ALCOHOL AND DRUGS: The patient denies drinking alcohol. Denies prescription drug abuse. Denies any problems with drugs. The patient is a poor historian.  MENTAL STATUS: The patient is alert and oriented, calm and cooperative. No agitation. Denies feeling depressed. Denies being hopeless or helpless. Denies auditory or visual hallucinations, delusions, or paranoid thinking. He could tell his date of birth and also the time of his surgery. Insight and judgment guarded. Impulse control poor.   IMPRESSION:  1.  History of schizophrenia, chronic,  but he has not been on medications for some time.    2.  Status post traumatic brain injury with brain surgery.  RECOMMENDATIONS: Observe the patient in the Emergency Room and intake worker is to call the family for further details and will call the boarding home for further details, and medications can be started back as needed.  Will D/C IVC on 11/28/2014 as pt has been stable and calm and will be discharged home with meds and will keep up his follow up appt /    ____________________________ Jannet MantisSurya K. Guss Bundehalla, MD skc:ts D: 11/27/2014 17:25:28 ET T: 11/27/2014 18:37:42 ET JOB#: [id number]  cc: Monika SalkSurya K. Guss Bundehalla, MD, <Dictator> Beau FannySURYA K Neziah Braley MD ELECTRONICALLY SIGNED 11/28/2014 12:27

## 2015-03-06 NOTE — Op Note (Signed)
PATIENT NAME:  Corey CoddingtonHORPE, Galan MR#:  [id number] DATE OF BIRTH:  10-Apr-1966  DATE OF PROCEDURE:  12/28/2014   PREOPERATIVE DIAGNOSES: Urinary retention, neurogenic bladder.   POSTOPERATIVE DIAGNOSES: Urinary retention, neurogenic bladder.   PROCEDURES PERFORMED: Cystoscopy, placement of a suprapubic tube catheter.   ATTENDING SURGEON: Claris GladdenAshley J. Shaquoia Miers, MD   ANESTHESIA: General anesthesia.   ESTIMATED BLOOD LOSS: Minimal.   DRAINS: A 16 French Foley catheter per SP tube tract.   SPECIMENS: None.   COMPLICATIONS: None.   INDICATION: This is a 49 year old gentleman with a neurogenic bladder and history of recurrent urinary retention and acute renal failure. The etiology of his urinary retention is unknown, but felt to be, perhaps, substance abuse related. He presented to the Emergency Room in acute renal failure and urinary tension, at which time, a penile Foley catheter was placed. On follow-up that day in the office, he indicated that he requires sedation, as he can become quite violent with Foley catheter changes; therefore, he was counseled to undergo placement of a suprapubic tube in the operating room, which will be more amenable to change. Risks and benefits of the procedure were explained in detail. The patient agreed to proceed as planned.   PROCEDURE IN DETAIL: The patient was correctly identified in the preoperative holding area and informed consent was confirmed. He was brought to the operating suite and placed on the table in the supine position. At this time, a universal timeout protocol was performed. All team members were identified, Venodyne boots were placed, and he was administered IV ampicillin and gentamicin in the perioperative period. He was then placed under general anesthesia, and his 4816 French Foley catheter, which was previously indwelling, was removed. He was prepped and draped in standard surgical fashion. A 22 French rigid cystoscope was advanced per urethra into  the bladder without difficulty. He did have a large wide mouth diverticula near the dome of the bladder, and his bladder was moderately trabeculated with distention. The bladder was then filled. A finder needle was used approximately 2 fingerbreadths above the pubic symphysis, using a spinal needle, which was seen to pass into the anterior wall of the bladder under direct visualization. The skin was then incised using a knife. A suprapubic tube trocar was then used under direct visualization, introduced into the same location within the bladder as finder needle. This was performed without difficulty. The inner cannula of the trocar was removed and a 16 French Foley catheter was slid through this area into the bladder. The balloon was inflated with 10 mL of sterile water and the trocar sheath was removed. The suprapubic tube was secured in place using nylon sutures x 2. A dressing was applied around the SP tube site, and the cystoscope was removed. The patient was reversed from anesthesia, repositioned in the supine position, and taken to the PACU in stable condition. There were no complications in this case.    ____________________________ Claris GladdenAshley J. Davison Ohms, MD ajb:mw D: 12/28/2014 07:58:31 ET T: 12/28/2014 11:15:04 ET JOB#: [id number]  cc: Claris GladdenAshley J. Hamda Klutts, MD, <Dictator> Claris GladdenASHLEY J Hubbert Landrigan MD ELECTRONICALLY SIGNED 01/12/2015 13:39
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
## 2015-03-06 NOTE — Discharge Summary (Signed)
PATIENT NAME:  Corey Cortez, Corey Cortez MR#:  [id number] DATE OF BIRTH:  [birth year]  DATE OF ADMISSION:  11/22/2014 DATE OF DISCHARGE:  11/26/2014  PRIMARY CARE PHYSICIAN: Nonlocal.   DISCHARGE DIAGNOSES:  1.  Acute renal failure and metabolic acidosis secondary to bladder outlet obstruction.  2.  Spontaneous bacterial peritonitis.  3.  Recurrent ascites.  4.  Gastrointestinal bleeding.  5.  Possible Mallory-Weiss tear.   6.  Right lower lobe pneumonia.  7.  Positive ANA with positive anti-SSA.  8.  Substance abuse and alcohol abuse.  9.  Previous history of cocaine use.  10.  Tobacco abuse.   CONDITION: Stable.   CODE STATUS: Full code.   HOME MEDICATIONS: Please refer to the medication reconciliation list.   SERVICES: The patient needs home health nurse and social worker.   DIET: Regular diet.   ACTIVITY: As tolerated.   FOLLOWUP CARE: With PCP; Dr. Shelle Ironein; and urologist, Dr. Evelene CroonWolff,  within 1-2 weeks. The patient needs keep Foley catheter in to see urologist.   REASON FOR ADMISSION: Abdominal pain and distention.   HOSPITAL COURSE: The patient is a 49 year old African American male with a history of recurrent ascites, COPD presented to the ED with worsening abdominal pain and distention. The patient was recently hospitalized from January 10th to 14th this year for ascites and renal failure. For detailed history and physical examination, please refer to the admission note dictated by Dr. Elpidio AnisSudini. Laboratory data on admission date showed creatinine 10.72, BUN 117, glucose 137, potassium 5.9, chloride 88, bicarbonate 18, lipase 225, WBC 14.7, hemoglobin 15.4. Urinalysis showed 107 wbc, 1+ bacteria. CAT scan of abdomen and pelvis showed significant increase of ascites and also a right pleural effusion with right lower lobe consolidation.  1.  Renal failure, likely due to urinary bladder obstruction. The patient got a Foley catheter. Acute renal failure improved. BUN and creatinine decreased to  normal range. Urologist saw the patient suggested keep Foley catheter and follow up with urologist as outpatient. 2.  Recurrent ascites, unclear etiology. Dr. Shelle Ironein suggested liver fibroscan which is negative. The patient got paracentesis, with 6 L fluid, body fluid laboratory test is consistent with SBP. The patient has been treated with Rocephin 2 mg IV PB for 5 days. Dr. Shelle Ironein suggested to change to Cipro 500 mg p.o. b.i.d. and Bactrim DS 500 mg daily and Bactrim DS 1 tablet once a day for lifelong SBP prophylaxis. The patient still has abdominal pain, but it is much better. No nausea, vomiting, tolerating diet.  3.  GI bleeding. Suspect that this was due to Mallory-Weiss tear. Dr. Shelle Ironein did an EGD, which is normal. The patient's hemoglobin is normal.  4.  Right lower lobe consolidation and possible pneumonia. The patient has been treated with Rocephin and Zithromax. The patient has no symptoms. Antibiotics will be changed to Cipro.  5.  Positive ANA and positive anti-SSA. According to Dr. Gavin PottersKernodle, no evidence of need for salivary gland biopsy. Patient needs to follow up with Dr. Gavin PottersKernodle as outpatient.  6.  Tobacco abuse. Smoking cessation was counseled. The patient will be given nicotine patch.  7.  According to physical therapy evaluation, the patient needs home health and social worker.    The patient's vital signs are stable. He is clinically stable. He will be discharged to home with home health and social worker. Discussed the patient's discharge plan with the patient, nurse, case manager, and Child psychotherapistsocial worker.   TIME SPENT: About 45 minutes.    ____________________________ Alfredo BattyQing  Imogene Burn, MD qc:bm D: 11/26/2014 14:41:00 ET T: 11/26/2014 23:28:32 ET JOB#: [id number]  cc: Shaune Pollack, MD, <Dictator> Shaune Pollack MD ELECTRONICALLY SIGNED 11/27/2014 13:07

## 2015-03-06 NOTE — Consult Note (Signed)
Pt presentation with ascites of a tense nature in the face of a normal liver on CT is puzzling.  I now do not think he has cirrhosis or end stage liver disease but cannot explain the origin of the ascites.  Pt can have mesenteric sclerosis with scaring and stranding from previous inflammation, previous surgery, etc.  The origin of his renal failure is also a puzzle.  Given the absense of any liver abnormality on 2 CT scans i will sign off. Reconsult if needed.  Electronic Signatures: Scot JunElliott, Kashina Mecum T (MD)  (Signed on 13-Jan-16 15:59)  Authored  Last Updated: 13-Jan-16 15:59 by Scot JunElliott, Deante Blough T (MD)
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
## 2015-03-06 NOTE — Consult Note (Signed)
CHIEF COMPLAINT and HISTORY:  Subjective/Chief Complaint renal failure, feeling poorly   History of Present Illness Patient is a disgruntled, angry individual with multiple issues and acute renal failure.  We are asked to place a dialysis catheter for emergent dialysis as his BUN is over 100 and his K is nearly 6.  Cr over 14.  He is angry and provides little history that is usable.   PAST MEDICAL/SURGICAL HISTORY:  Past Medical History:   Ulcers, Gastric:    Emphysema:   ALLERGIES:  Allergies:  Advil: Other  Family and Social History:  Family History Non-Contributory   Social History positive  tobacco (Current within 1 year), positive ETOH, positive Illicit drugs   + Tobacco Current (within 1 year)   Place of Living Home   Review of Systems:  Subjective/Chief Complaint feeling poorly.  little UOP.  Otherwise difficult to assess and gain accurate review No TIA/stroke/seizure No heat or cold intolerance No dysuria/hematuria No blurry or double vision No tinnitus or ear pain No rashes or ulcer   Fever/Chills No   Cough No   Sputum No   Abdominal Pain No   Diarrhea No   Constipation No   Nausea/Vomiting No   SOB/DOE No   Chest Pain No   Telemetry Reviewed NSR   Dysuria Yes   Tolerating Diet No   Medications/Allergies Reviewed Medications/Allergies reviewed   Physical Exam:  GEN well developed, well nourished, disheveled   HEENT pink conjunctivae, moist oral mucosa   NECK No masses  trachea midline   RESP normal resp effort  no use of accessory muscles  on oxygen   CARD regular rate  no JVD   VASCULAR ACCESS none yet   ABD denies tenderness  soft   GU Patient has refused Foley so far   LYMPH negative neck, negative axillae   EXTR negative cyanosis/clubbing, positive edema   SKIN normal to palpation, skin turgor good   NEURO cranial nerves intact, motor/sensory function intact   PSYCH poor insight   LABS:  Laboratory  Results: Hepatic:    10-Jan-16 04:12, Comprehensive Metabolic Panel  Bilirubin, Total 1.0  Alkaline Phosphatase 108  46-116  NOTE: New Reference Range  05/25/14  SGPT (ALT) 31  14-63  NOTE: New Reference Range  05/25/14  SGOT (AST) 24  Total Protein, Serum 9.6  Albumin, Serum 4.3    10-Jan-16 07:59, Comprehensive Metabolic Panel  Bilirubin, Total 0.8  Alkaline Phosphatase 111  46-116  NOTE: New Reference Range  05/25/14  SGPT (ALT) 28  14-63  NOTE: New Reference Range  05/25/14  SGOT (AST) 24  Total Protein, Serum 9.7  Albumin, Serum 4.2  Routine BB:    10-Jan-16 04:12, Type and Antibody Screen  ABO Group + Rh Type   A Positive  Antibody Screen NEGATIVE  Result(s) reported on 14 Nov 2014 at 05:04AM.  Routine Chem:    10-Jan-16 04:12, Comprehensive Metabolic Panel  Glucose, Serum 122  BUN 112  Creatinine (comp) 13.62  Sodium, Serum 124  Potassium, Serum 6.3  Chloride, Serum 92  CO2, Serum 14  Calcium (Total), Serum 8.8  Osmolality (calc) 286  eGFR (African American) 5  eGFR (Non-African American) 4  eGFR values <42m/min/1.73 m2 may be an indication of chronic  kidney disease (CKD).  Calculated eGFR, using the MRDR Study equation, is useful in   patients with stable renal function.  The eGFR calculation will not be reliable in acutely ill patients  when serum creatinine is changing rapidly. It  is not useful in  patients on dialysis. The eGFR calculation may not be applicable  to patients at the low and high extremes of body sizes, pregnant  women, and vegetarians.  Result Comment   POTASSIUM/BUN/CREATININE - Slight hemolysis, interpret results with  AST/TOTAL PROTEIN - caution.   Result(s) reported on 14 Nov 2014 at 04:39AM.  Cleta Alberts 18    10-Jan-16 04:12, Lipase  Lipase 185  Result(s) reported on 14 Nov 2014 at 04:34AM.    10-Jan-16 07:59, Ammonia, Plasma  Ammonia, Plasma 35  Result(s) reported on 14 Nov 2014 at 08:33AM.    10-Jan-16 07:59,  Comprehensive Metabolic Panel  Glucose, Serum 95  BUN 118  Creatinine (comp) 14.61  Sodium, Serum 126  Potassium, Serum 5.8  Chloride, Serum 91  CO2, Serum 21  Calcium (Total), Serum 9.9  Osmolality (calc) 291  eGFR (African American) 5  eGFR (Non-African American) 4  eGFR values <54m/min/1.73 m2 may be an indication of chronic  kidney disease (CKD).  Calculated eGFR, using the MRDR Study equation, is useful in   patients with stable renal function.  The eGFR calculation will not be reliable in acutely ill patients  when serum creatinine is changing rapidly. It is not useful in  patients on dialysis. The eGFR calculation may not be applicable  to patients at the low and high extremes of body sizes, pregnant  women, and vegetarians.  Anion Gap 14  Cardiac:    10-Jan-16 04:12, Troponin I  Troponin I < 0.02  0.00-0.05  0.05 ng/mL or less: NEGATIVE   Repeat testing in 3-6 hrs   if clinically indicated.  >0.05 ng/mL: POTENTIAL   MYOCARDIAL INJURY. Repeat   testing in 3-6 hrs if   clinically indicated.  NOTE: An increase or decrease   of 30% or more on serial   testing suggests a   clinically important change  Routine Coag:    10-Jan-16 04:12, Activated PTT  Activated PTT (APTT) 23.6  A HCT value >55% may artifactually increase the APTT. In one study,  the increase was an average of 19%.  Reference: "Effect on Routine and Special Coagulation Testing Values  of Citrate Anticoagulant Adjustment in Patients with High HCT Values."  American Journal of Clinical Pathology 2006;126:400-405.    10-Jan-16 04:12, Prothrombin Time  Prothrombin 14.7  INR 1.2  INR reference interval applies to patients on anticoagulant therapy.  A single INR therapeutic range for coumarins is not optimal for all  indications; however, the suggested range for most indications is  2.0 - 3.0.  Exceptions to the INR Reference Range may include: Prosthetic heart  valves, acute myocardial infarction,  prevention of myocardial  infarction, and combinations of aspirin and anticoagulant. The need  for a higher or lower target INR must be assessed individually.  Reference: The Pharmacology and Management of the Vitamin K   antagonists: the seventh ACCP Conference on Antithrombotic and  Thrombolytic Therapy. CVOZDG.6440Sept:126 (3suppl): [id number]  A HCT value >55% may artifactually increase the PT.  In one study,   the increase was an average of 25%.  Reference:  "Effect on Routine and Special Coagulation Testing Values  of Citrate Anticoagulant Adjustment in Patients with High HCT Values."  American Journal of Clinical Pathology 2006;126:400-405.  Routine Hem:    10-Jan-16 04:12, Hemogram, Platelet Count  WBC (CBC) 9.2  RBC (CBC) 5.77  Hemoglobin (CBC) 17.3  Hematocrit (CBC) 52.7  Platelet Count (CBC) 308  Result(s) reported on 14 Nov 2014 at 04:27AM.  MCV 91  MCH 30.0  MCHC 32.8  RDW 14.4   RADIOLOGY:  Radiology Results: XRay:    10-Jan-16 04:30, Abdomen AP Only  Abdomen AP Only  REASON FOR EXAM:    severe abd pain and distention  COMMENTS:   Bedside (portable):Y    PROCEDURE: DXR - DXR ABDOMEN AP ONLY  - Nov 14 2014  4:30AM     CLINICAL DATA:  Severe abdominal pain for 1 week. History of gastric  ulcers.    EXAM:  ABDOMEN - 1 VIEW    COMPARISON:  None.    FINDINGS:  The bowel gas pattern is normal. Hazy appearance the abdomen. No  radio-opaque calculi or other significant radiographic abnormality  are seen.     IMPRESSION:  Hazy appearance of the abdomen concerning for ascites. Nonspecific  bowel gas pattern.      Electronically Signed    By: Elon Alas    On: 11/14/2014 05:01         Verified By: Ricky Ala, M.D.,    10-Jan-16 04:30, Chest Portable Single View  Chest Portable Single View  REASON FOR EXAM:    severe abd pain, eval for free air  COMMENTS:       PROCEDURE: DXR - DXR PORTABLE CHEST SINGLE VIEW  - Nov 14 2014  4:30AM      CLINICAL DATA:  Abdominal pain for 1 week. Shortness of breath for 1  week.    EXAM:  PORTABLE CHEST - 1 VIEW    COMPARISON:  None.    FINDINGS:  Low inspiratory portable examination. Bandlike densities in lung  bases bilaterally, patchy RIGHT lower lobe airspace opacity.  Blunting of the costophrenic angles. Cardio mediastinal silhouette  is grossly normal. No pneumothorax. Soft tissue planes and included  osseous structures are nonsuspicious.     IMPRESSION:  Low inspiratory examination with bibasilar atelectasis, superimposed  RIGHT lung base pneumonia is a consideration, concerning small RIGHT  suspected pleural effusion.    Recommend follow-up PA and lateral views the chest on clinically  able.      Electronically Signed    By: Elon Alas    On: 11/14/2014 05:02         Verified By: Ricky Ala, M.D.,  [place]:    10-Jan-16 09:40, US Guided Paracentesis  US Guided Paracentesis  REASON FOR EXAM:    severe ascities  COMMENTS:       PROCEDURE: [place]  - US GUIDED PARACENTESIS  - Nov 14 2014  9:40AM     INDICATION:  Symptomatic ascites of uncertain etiology. Please perform diagnostic  and therapeutic paracentesis.    EXAM:  ULTRASOUND-GUIDED PARACENTESIS    COMPARISON:  CT abdomen pelvis - 11/14/2014    MEDICATIONS:  None.    COMPLICATIONS:  None immediate    TECHNIQUE:  Informed written consent was obtained from the patient after a  discussion of the risks, benefits and alternatives to treatment. A  timeout was performed prior to the initiation of the procedure.    Initial ultrasound scanning demonstrates a large amount of ascites  within the right lower abdominal quadrant. The right lower abdomen  was prepped and draped in the usual sterile fashion. 1% lidocaine  with epinephrine was used for local anesthesia. An ultrasound image  was saved for documentation purposed. An 8 Fr Safe-T-Centesis  catheter was introduced. The paracentesis was  performed. The  catheter was removed and a dressing was applied. The patient  tolerated the procedure  well without immediate post procedural  complication.    FINDINGS:  A total of approximately 6 liters of serous fluid was removed.  Samples were sent to the laboratory as requested by the clinical  team.     IMPRESSION:  Successful ultrasound-guided paracentesis yielding 6 liters of  peritoneal fluid.    Electronically Signed    By: Sandi Mariscal M.D.    On: 11/14/2014 09:51         Verified By: Aileen Fass, M.D.,  [place]:    10-Jan-16 04:30, Abdomen AP Only  PACS Image    10-Jan-16 04:30, Chest Portable Single View  PACS Image    10-Jan-16 05:09, CT Abdomen and Pelvis Without Contrast  PACS Image    10-Jan-16 09:40, US Guided Paracentesis  PACS Image  CT:    10-Jan-16 05:09, CT Abdomen and Pelvis Without Contrast  CT Abdomen and Pelvis Without Contrast  REASON FOR EXAM:    (1) severe abd pain and distention. NO CONTRAST; (2)   severe abd pain and distent  COMMENTS:   May transport without cardiac monitor    PROCEDURE: CT  - CT ABDOMEN AND PELVIS W0  - Nov 14 2014  5:09AM     CLINICAL DATA:  Shortness of breath and abdominal pain for 1 week.  History of gastric ulcers and emphysema. No IV contrast due to acute  renal failure.    EXAM:  CT ABDOMEN AND PELVIS WITHOUT CONTRAST    TECHNIQUE:  Multidetector CT imaging of the abdomen and pelvis was performed  following the standard protocol without IV contrast.    COMPARISON:  None.    FINDINGS:  Small right pleural effusion with basilar atelectasis. Residual  contrast material in the esophagus without distention, suggesting  reflux or dysmotility.Atelectasis in the left lung base.    There is massive abdominal and pelvic free fluid with  low-attenuation suggesting ascites. Abnormal nodular infiltration in  the mesentery and omentum. Appearance is worrisome for peritoneal  carcinomatosis or pseudomyxoma  peritonei. Alternatively, this could  indicate extensive fat necrosis or atypical mesenteric edema with an  vascular crowding.  The gallbladder is mildly distended with increased density  consistent with sludge. The unenhanced appearance ofthe liver,  spleen, pancreas, adrenal glands, kidneys, abdominal aorta, inferior  vena cava, and retroperitoneal lymph nodes is unremarkable. Stomach,  small bowel, and colon are decompressed. Abdominal wall musculature  appears intact.    Pelvis: Prostate gland is mildly enlarged. Bladder wall is not  thickened. Appendix is normal. No pelvic mass or lymphadenopathy is  appreciated. Degenerative changes in the spine. No destructive bone  lesions appreciated.     IMPRESSION:  Massive abdominal andpelvic ascites with nodular infiltrative  appearance to the mesentery and omentum worrisome for peritoneal  carcinomatosis or pseudomyxoma paratenon eye. Suggest paracentesis  with fluid sampling for further evaluation. Small right pleural  effusion with basilar atelectasis. Sludge in the gallbladder.      Electronically Signed    By: Lucienne Capers M.D.    On: 11/14/2014 05:33         Verified By: Neale Burly, M.D.,   ASSESSMENT AND PLAN:  Assessment/Admission Diagnosis ARF Needs Vascath for dialysis but currently refusing polysubstance abuse   Plan primary service has contacted mother.  Aggressive care desired Patient is waxing and waning about getting care, but sounds like he may agree to dialysis catheter.  He is currently being treated for agitation, and once he is calm I may be able to  place a dialysis catheter.  Will check back in a little while   level 4 consult   Electronic Signatures: Algernon Huxley (MD)  (Signed 10-Jan-16 10:13)  Authored: Chief Complaint and History, PAST MEDICAL/SURGICAL HISTORY, ALLERGIES, Family and Social History, Review of Systems, Physical Exam, LABS, RADIOLOGY, Assessment and Plan   Last Updated:  10-Jan-16 10:13 by Algernon Huxley (MD)

## 2015-03-06 NOTE — Consult Note (Signed)
Details:   - GI Note:  Ascites fluid is c/w SBP.  Will give 1.5 g/kg albumin now and 1 g/kg on day 3.  Needs 5 day course of third generation cephalosporin (can downgrade from zosyn)   Electronic Signatures: Dow Adolphein, Lorette Peterkin (MD)  (Signed 19-Jan-16 10:09)  Authored: Details   Last Updated: 19-Jan-16 10:09 by Dow Adolphein, Cassandre Oleksy (MD)

## 2015-08-26 ENCOUNTER — Emergency Department
Admission: EM | Admit: 2015-08-26 | Discharge: 2015-08-26 | Disposition: A | Discharge status: Home / Self Care | Payer: Medicare Other | Attending: Emergency Medicine | Admitting: Emergency Medicine

## 2015-08-26 ENCOUNTER — Encounter: Payer: Self-pay | Admitting: Emergency Medicine

## 2015-08-26 DIAGNOSIS — N3289 Other specified disorders of bladder: Secondary | ICD-10-CM | POA: Insufficient documentation

## 2015-08-26 DIAGNOSIS — T83511A Infection and inflammatory reaction due to indwelling urethral catheter, initial encounter: Secondary | ICD-10-CM | POA: Insufficient documentation

## 2015-08-26 DIAGNOSIS — Z720 Tobacco use: Secondary | ICD-10-CM | POA: Insufficient documentation

## 2015-08-26 DIAGNOSIS — Z79899 Other long term (current) drug therapy: Secondary | ICD-10-CM | POA: Diagnosis not present

## 2015-08-26 DIAGNOSIS — Y828 Other medical devices associated with adverse incidents: Secondary | ICD-10-CM | POA: Insufficient documentation

## 2015-08-26 DIAGNOSIS — N390 Urinary tract infection, site not specified: Secondary | ICD-10-CM | POA: Diagnosis not present

## 2015-08-26 DIAGNOSIS — Z791 Long term (current) use of non-steroidal anti-inflammatories (NSAID): Secondary | ICD-10-CM | POA: Diagnosis not present

## 2015-08-26 HISTORY — DX: Unspecified convulsions: R56.9

## 2015-08-26 LAB — URINALYSIS COMPLETE WITH MICROSCOPIC (ARMC ONLY)
BILIRUBIN URINE: NEGATIVE
GLUCOSE, UA: NEGATIVE mg/dL
KETONES UR: NEGATIVE mg/dL
NITRITE: POSITIVE — AB
Protein, ur: 30 mg/dL — AB
Specific Gravity, Urine: 1.014 (ref 1.005–1.030)
pH: 5 (ref 5.0–8.0)

## 2015-08-26 MED ORDER — HYDROCODONE-ACETAMINOPHEN 5-325 MG PO TABS
2.0000 | ORAL_TABLET | Freq: Once | ORAL | Status: AC
  Administered 2015-08-26: 2 via ORAL

## 2015-08-26 MED ORDER — HYDROCODONE-ACETAMINOPHEN 5-325 MG PO TABS
ORAL_TABLET | ORAL | Status: AC
  Filled 2015-08-26: qty 2

## 2015-08-26 MED ORDER — LIDOCAINE HCL 2 % EX GEL
1.0000 "application " | Freq: Once | CUTANEOUS | Status: AC
  Administered 2015-08-26: 1 via TOPICAL

## 2015-08-26 MED ORDER — LIDOCAINE HCL 2 % EX GEL
CUTANEOUS | Status: AC
  Filled 2015-08-26: qty 10

## 2015-08-26 MED ORDER — CEPHALEXIN 500 MG PO CAPS: 500.0000 mg | ORAL_CAPSULE | Freq: Four times a day (QID) | ORAL | Status: DC
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
## 2015-08-26 NOTE — Discharge Instructions (Signed)
Please call urology and advanced home health to set up close follow-up. Please complete antibiotics as prescribed. Return to the emergency room if Corey Cortez develops a fever, inability urinate, chills, feel weak or dizzy, develop abdominal pain, nausea vomiting or other new concerns arise.  Urology advises that you should have them change home health or Urology change your catheter as soon as possible.  No driving today or while using Vicodin which you were given in the ER.  Urinary Tract Infection Urinary tract infections (UTIs) can develop anywhere along your urinary tract. Your urinary tract is your body's drainage system for removing wastes and extra water. Your urinary tract includes two kidneys, two ureters, a bladder, and a urethra. Your kidneys are a pair of bean-shaped organs. Each kidney is about the size of your fist. They are located below your ribs, one on each side of your spine. CAUSES Infections are caused by microbes, which are microscopic organisms, including fungi, viruses, and bacteria. These organisms are so small that they can only be seen through a microscope. Bacteria are the microbes that most commonly cause UTIs. SYMPTOMS  Symptoms of UTIs may vary by age and gender of the patient and by the location of the infection. Symptoms in young women typically include a frequent and intense urge to urinate and a painful, burning feeling in the bladder or urethra during urination. Older women and men are more likely to be tired, shaky, and weak and have muscle aches and abdominal pain. A fever may mean the infection is in your kidneys. Other symptoms of a kidney infection include pain in your back or sides below the ribs, nausea, and vomiting. DIAGNOSIS To diagnose a UTI, your caregiver will ask you about your symptoms. Your caregiver will also ask you to provide a urine sample. The urine sample will be tested for bacteria and white blood cells. White blood cells are made by your body to help  fight infection. TREATMENT  Typically, UTIs can be treated with medication. Because most UTIs are caused by a bacterial infection, they usually can be treated with the use of antibiotics. The choice of antibiotic and length of treatment depend on your symptoms and the type of bacteria causing your infection. HOME CARE INSTRUCTIONS  If you were prescribed antibiotics, take them exactly as your caregiver instructs you. Finish the medication even if you feel better after you have only taken some of the medication.  Drink enough water and fluids to keep your urine clear or pale yellow.  Avoid caffeine, tea, and carbonated beverages. They tend to irritate your bladder.  Empty your bladder often. Avoid holding urine for long periods of time.  Empty your bladder before and after sexual intercourse.  After a bowel movement, women should cleanse from front to back. Use each tissue only once. SEEK MEDICAL CARE IF:   You have back pain.  You develop a fever.  Your symptoms do not begin to resolve within 3 days. SEEK IMMEDIATE MEDICAL CARE IF:   You have severe back pain or lower abdominal pain.  You develop chills.  You have nausea or vomiting.  You have continued burning or discomfort with urination. MAKE SURE YOU:   Understand these instructions.  Will watch your condition.  Will get help right away if you are not doing well or get worse.   This information is not intended to replace advice given to you by your health care provider. Make sure you discuss any questions you have with your health care  provider.   Document Released: 08/01/2005 Document Revised: 07/13/2015 Document Reviewed: 11/30/2011 Elsevier Interactive Patient Education [employer].

## 2015-08-26 NOTE — ED Provider Notes (Signed)
[facility] Emergency Department Provider Note REMINDER - THIS NOTE IS NOT A FINAL MEDICAL RECORD UNTIL IT IS SIGNED. UNTIL THEN, THE CONTENT BELOW MAY REFLECT INFORMATION FROM A DOCUMENTATION TEMPLATE, NOT THE ACTUAL PATIENT VISIT. ____________________________________________  Time seen: Approximately 11:12 AM  I have reviewed the triage vital signs and the nursing notes.   HISTORY  Chief Complaint bladder spasms     HPI Corey Cortez is a 49 y.o. male presents today for bladder spasm. He reports that for about the last 4-5 days he has experienced spasm in his bladder that lasts about 5 minutes off and on every few hours. He continues to make normal urine output is driving his suprapubic bag normally with yellow urine. No fevers or chills. No abdominal pain except during episodes of "spasm". Denies any fevers. No nausea or vomiting. He does report that he's had similar symptoms with infection in the past, and questions a male have a slight infection at this time.   Past Medical History  Diagnosis Date  . Seizures (HCC)     There are no active problems to display for this patient.   Past Surgical History  Procedure Laterality Date  . Brain surgery      Current Outpatient Rx  Name  Route  Sig  Dispense  Refill  . naproxen sodium (ANAPROX) 220 MG tablet   Oral   Take 220 mg by mouth 2 (two) times daily with a meal.         . cephALEXin (KEFLEX) 500 MG capsule   Oral   Take 1 capsule (500 mg total) by mouth 4 (four) times daily.   40 capsule   0   . oxybutynin (DITROPAN) 5 MG tablet   Oral   Take 5 mg by mouth 3 (three) times daily.           Allergies Advil  No family history on file.  Social History Social History  Substance Use Topics  . Smoking status: Current Every Day Smoker  . Smokeless tobacco: None  . Alcohol Use: Yes    Review of Systems Constitutional: No fever/chills Eyes: No visual changes. ENT: No sore  throat. Gastrointestinal: See history of present illness. Genitourinary: No foul odor. Catheter draining normally, he is able to empty his bladder and does not feel like he is unable to urinate. Musculoskeletal: Negative for back pain. Skin: Negative for rash. Neurological: Negative for headaches, focal weakness or numbness.  10-point ROS otherwise negative.  ____________________________________________   PHYSICAL EXAM:  VITAL SIGNS: ED Triage Vitals  Enc Vitals Group     BP 08/26/15 0934 146/86 mmHg     Pulse Rate 08/26/15 0934 79     Resp 08/26/15 0934 18     Temp 08/26/15 0934 98 F (36.7 C)     Temp Source 08/26/15 0934 Oral     SpO2 08/26/15 0934 99 %     Weight 08/26/15 0934 190 lb (86.183 kg)     Height 08/26/15 0934  (1.753 m)     Head Cir --      Peak Flow --      Pain Score 08/26/15 0935 10     Pain Loc --      Pain Edu? --      Excl. in GC? --    Constitutional: Alert and oriented. Well appearing and in no acute distress. Eyes: Conjunctivae are normal. PERRL. EOMI. Head: Atraumatic. Nose: No congestion/rhinnorhea. Mouth/Throat: Mucous membranes are moist.  Oropharynx  non-erythematous. Neck: No stridor.   Cardiovascular: Normal rate, regular rhythm. Grossly normal heart sounds.  Good peripheral circulation. Respiratory: Normal respiratory effort.  No retractions. Lungs CTAB. Gastrointestinal: Soft and nontender. No distention. No abdominal bruits. No CVA tenderness. Suprapubic catheter in place, no drainage around site. No erythema or redness. No tenderness. Draining clear yellow urine, patient emptied full 500 mL walking bag. Normal penis. Musculoskeletal: No lower extremity tenderness nor edema.  No joint effusions. Neurologic:  Normal speech and language. No gross focal neurologic deficits are appreciated. No gait instability. Skin:  Skin is warm, dry and intact. No rash noted. Psychiatric: Mood and affect are normal. Speech and behavior are  normal.  ____________________________________________   LABS (all labs ordered are listed, but only abnormal results are displayed)  Labs Reviewed  URINALYSIS COMPLETEWITH MICROSCOPIC ([facility] ONLY) - Abnormal; Notable for the following:    Color, Urine YELLOW (*)    APPearance CLOUDY (*)    Hgb urine dipstick 3+ (*)    Protein, ur 30 (*)    Nitrite POSITIVE (*)    Leukocytes, UA 3+ (*)    Bacteria, UA MANY (*)    Squamous Epithelial / LPF 0-5 (*)    All other components within normal limits  URINE CULTURE   ____________________________________________  EKG   ____________________________________________  RADIOLOGY   ____________________________________________   PROCEDURES  Procedure(s) performed: None  Critical Care performed: No  ____________________________________________   INITIAL IMPRESSION / ASSESSMENT AND PLAN / ED COURSE  Pertinent labs & imaging results that were available during my care of the patient were reviewed by me and considered in my medical decision making (see chart for details).  Patient presents today for apparent bladder spasms. VS stable, afebrile in no distress. Based on history that that he provides this would likely represent possible spasm with a mild urinary tract infection. Did discuss with urology Dr. Mena GoesEskridge who recommends initiation of antibiotics and repeat urinalysis with me. He advises not changing the catheter in the ER, but instead will be able to see the patient is afternoon at the office to have it changed. The patient is in no distress. Urinalysis does appear to be likely infected. No evidence of systemic symptoms. We'll treat the patient with cephalexin, and have advised him close follow-up for which she is to go to the office today for catheter change. The patient is upset at the time of discharge and I'm not providing a prescription for hydrocodone, however I advised that I recommend close follow-up with his urologist today  and that I do not believe that she tried cutting it is indicated at this time, though should he require that he may pursue this through his urologist whom he is to see today.  Return precautions advised. ____________________________________________   FINAL CLINICAL IMPRESSION(S) / ED DIAGNOSES  Final diagnoses:  Bladder spasm  Urinary tract infection associated with catheterization of urinary tract, initial encounter      Sharyn CreamerMark Alexsander Cavins, MD 08/26/15 1118

## 2015-08-26 NOTE — ED Notes (Signed)
Pt to ED via EMS transport from home, pt has suprapubic catheter in place and has been having bladder spasms since Wednesday, states he still urinates at times, pt states pain has been severe at times, taking oxybutynin with no relief, took 4 pills this am

## 2015-08-28 LAB — URINE CULTURE: SPECIAL REQUESTS: NORMAL

## 2015-08-29 NOTE — Progress Notes (Signed)
ED Antimicrobial Stewardship Positive Culture Follow Up   [name] is an 49 y.o. male who presented to [facility] on 08/26/2015 with a chief complaint of  Chief Complaint  Patient presents with  . bladder spasms     Recent Results (from the past 720 hour(s))  Urine culture     Status: None   Collection Time: 08/26/15  9:51 AM  Result Value Ref Range Status   Specimen Description URINE, RANDOM  Final   Special Requests Normal  Final   Culture >=100,000 COLONIES/mL KLEBSIELLA PNEUMONIAE  Final   Report Status 08/28/2015 FINAL  Final   Organism ID, Bacteria KLEBSIELLA PNEUMONIAE  Final      Susceptibility   Klebsiella pneumoniae - MIC*    AMPICILLIN >=32 RESISTANT Resistant     CEFTAZIDIME <=1 SENSITIVE Sensitive     CEFAZOLIN <=4 SENSITIVE Sensitive     CEFTRIAXONE <=1 SENSITIVE Sensitive     CIPROFLOXACIN <=0.25 SENSITIVE Sensitive     GENTAMICIN <=1 SENSITIVE Sensitive     IMIPENEM <=0.25 SENSITIVE Sensitive     TRIMETH/SULFA <=20 SENSITIVE Sensitive     PIP/TAZO Value in next row Sensitive      SENSITIVE<=4    * >=100,000 COLONIES/mL KLEBSIELLA PNEUMONIAE    Patient received Cephalexin 500 mg q6h upon discharge from ED.  Klebsiella pneumoniae isolated from urine cultures is susceptible to this antibiotic.  No further intervention required.    Gitel Beste G 08/29/2015, 12:56 PM

## 2015-09-23 ENCOUNTER — Emergency Department
Admission: EM | Admit: 2015-09-23 | Discharge: 2015-09-23 | Discharge status: Against Medical Advice | Payer: Medicare Other | Attending: Emergency Medicine | Admitting: Emergency Medicine

## 2015-09-23 ENCOUNTER — Encounter: Payer: Self-pay | Admitting: Emergency Medicine

## 2015-09-23 DIAGNOSIS — N3289 Other specified disorders of bladder: Secondary | ICD-10-CM | POA: Diagnosis not present

## 2015-09-23 DIAGNOSIS — Z79899 Other long term (current) drug therapy: Secondary | ICD-10-CM | POA: Diagnosis not present

## 2015-09-23 DIAGNOSIS — F17200 Nicotine dependence, unspecified, uncomplicated: Secondary | ICD-10-CM | POA: Insufficient documentation

## 2015-09-23 DIAGNOSIS — R109 Unspecified abdominal pain: Secondary | ICD-10-CM | POA: Diagnosis present

## 2015-09-23 DIAGNOSIS — Z792 Long term (current) use of antibiotics: Secondary | ICD-10-CM | POA: Diagnosis not present

## 2015-09-23 DIAGNOSIS — Z791 Long term (current) use of non-steroidal anti-inflammatories (NSAID): Secondary | ICD-10-CM | POA: Diagnosis not present

## 2015-09-23 DIAGNOSIS — Z466 Encounter for fitting and adjustment of urinary device: Secondary | ICD-10-CM | POA: Insufficient documentation

## 2015-09-23 DIAGNOSIS — R451 Restlessness and agitation: Secondary | ICD-10-CM | POA: Insufficient documentation

## 2015-09-23 NOTE — ED Provider Notes (Signed)
[facility] Emergency Department Provider Note REMINDER - THIS NOTE IS NOT A FINAL MEDICAL RECORD UNTIL IT IS SIGNED. UNTIL THEN, THE CONTENT BELOW MAY REFLECT INFORMATION FROM A DOCUMENTATION TEMPLATE, NOT THE ACTUAL PATIENT VISIT. ____________________________________________  Time seen: Approximately 11:48 AM  I have reviewed the triage vital signs and the nursing notes.   HISTORY  Chief Complaint Abdominal Pain    HPI Micahel Fenton Cortez is a 49 y.o. male reports he has ongoing "bladder spasms".  On entries are in the patient tells me that he must see a urology specialist today, and he cannot take anymore of these spasms.  Patient reports that from time to time he has discomfort and spasm over the bladder area. Reports it is worse when he tries to sit up. He was [facility] where they changed his catheter per the patient and it is draining normally, he is on antibiotics, and denies any unusual burning, fever, chills, redness.  No nausea vomiting. No trouble breathing. No chest pain.  Patient tells me that he wishes to see a urologist to talk about having this catheter removed in a different surgery performed.  Patient reports that he was in court today, and just could not handle the discomfort of the bladder in order to make it through his case.   Past Medical History  Diagnosis Date  . Seizures (HCC)     There are no active problems to display for this patient.   Past Surgical History  Procedure Laterality Date  . Brain surgery      Current Outpatient Rx  Name  Route  Sig  Dispense  Refill  . cephALEXin (KEFLEX) 500 MG capsule   Oral   Take 1 capsule (500 mg total) by mouth 4 (four) times daily.   40 capsule   0   . naproxen sodium (ANAPROX) 220 MG tablet   Oral   Take 220 mg by mouth 2 (two) times daily with a meal.         . oxybutynin (DITROPAN) 5 MG tablet   Oral   Take 5 mg by mouth 3 (three) times daily.            Allergies Advil  No family history on file.  Social History Social History  Substance Use Topics  . Smoking status: Current Every Day Smoker  . Smokeless tobacco: None  . Alcohol Use: Yes    Review of Systems Constitutional: No fever/chills Cardiovascular: Denies chest pain. Respiratory: Denies shortness of breath. Gastrointestinal: No abdominal pain.  No nausea, no vomiting.  No diarrhea.  No constipation. Genitourinary: See history of present illness. Denies any redness or drainage around his superpubic catheter site. Musculoskeletal: Negative for back pain. Skin: Negative for rash. Neurological: Negative for headaches, focal weakness or numbness.  10-point ROS otherwise negative.  ____________________________________________   PHYSICAL EXAM:  VITAL SIGNS: ED Triage Vitals  Enc Vitals Group     BP 09/23/15 1110 126/86 mmHg     Pulse Rate 09/23/15 1110 64     Resp 09/23/15 1110 16     Temp 09/23/15 1110 97.9 F (36.6 C)     Temp src --      SpO2 09/23/15 1110 96 %     Weight 09/23/15 1110 195 lb (88.451 kg)     Height 09/23/15 1110 5\' 9"  (1.753 m)     Head Cir --      Peak Flow --      Pain Score  09/23/15 1112 9     Pain Loc --      Pain Edu? --      Excl. in GC? --    Constitutional: Alert and oriented. Well appearing and in no acute distress. Eyes: Conjunctivae are normal. PERRL. EOMI. Head: Atraumatic. Nose: No congestion/rhinnorhea. Mouth/Throat: Mucous membranes are moist.  Oropharynx non-erythematous. Neck: No stridor.   Cardiovascular: Normal rate, regular rhythm. Grossly normal heart sounds.  Good peripheral circulation. Respiratory: Normal respiratory effort.  No retractions. Lungs CTAB. Gastrointestinal: Soft and nontender. No distention. No abdominal bruits. No CVA tenderness. Genitourinary: Patient has suprapubic catheter in place, draining yellow clear urine into his bag. No evidence of leak, no surrounding erythema or redness or  tenderness. There is no obvious bladder distention. Musculoskeletal: No lower extremity tenderness nor edema.   Neurologic:  Normal speech and language. No gross focal neurologic deficits are appreciated. No gait instability. Skin:  Skin is warm, dry and intact. No rash noted. Psychiatric: Mood and affect are slightly agitated, seems upset with his catheter situation. Denies any thoughts of volume harm himself or anyone else.  ____________________________________________   LABS (all labs ordered are listed, but only abnormal results are displayed)  Labs Reviewed - No data to display ____________________________________________  EKG  Reviewed and interpreted by me Normal sinus rhythm EKG time 1110 QRS 110 QTc 400 T waves probable early repolarization abnormality, no evidence of ST elevation or acute ischemic changes ____________________________________________  RADIOLOGY   ____________________________________________   PROCEDURES  Procedure(s) performed: None  Critical Care performed: No  ____________________________________________   INITIAL IMPRESSION / ASSESSMENT AND PLAN / ED COURSE  Pertinent labs & imaging results that were available during my care of the patient were reviewed by me and considered in my medical decision making (see chart for details).  Patient transfer evaluation of ongoing bladder discomfort and spasm. He is requesting that I give him a shot of morphine and oxycodone and have urology see him. I discussed with the patient, and I called Dr. Apolinar Junes of urology who knows the patient well. We reviewed his case and care and she noted that there is no emergent need for urologic consultation in the ER and I would agree. I discussed with the patient, he will call urology to set up close follow-up care. After notifying patient the plan of care, he tells me that he is going he does not need any further assistance from [place]. He refused to take discharge  instructions, and eloped from the ER. He appears to have the capacity to make decisions, did not demonstrate any altered level of consciousness, did not appear to be under the influence. Patient did voice understanding of the plan to call urology to set up a follow-up appointment. ____________________________________________   FINAL CLINICAL IMPRESSION(S) / ED DIAGNOSES  Final diagnoses:  Bladder spasm      Sharyn Creamer, MD 09/23/15 1154

## 2015-09-23 NOTE — ED Notes (Signed)
Pt arrived via EMS for complaints of sudden abdominal pain. Denies nausea and vomiting. Pt has a suprapubic catheter. Pt requesting to see a urologist.

## 2015-10-10 IMAGING — CT CT ABD-PELV W/O CM
3 of 4 series · 9 of 46 positions shown, 16 images · non-contrast
Comparison: 11/16/2014, 11/14/2014

CLINICAL DATA: Umbilical pain and abdominal distension

EXAM:
CT ABDOMEN AND PELVIS WITHOUT CONTRAST
TECHNIQUE: Multidetector CT imaging of the abdomen and pelvis was performed
following the standard protocol without IV contrast.

[Series 4: lung · axial · 0.78mm/px · z∈[-636,-531]mm · 5 of 33 slices shown, 10 images]
[im 6/33  soft-tissue]
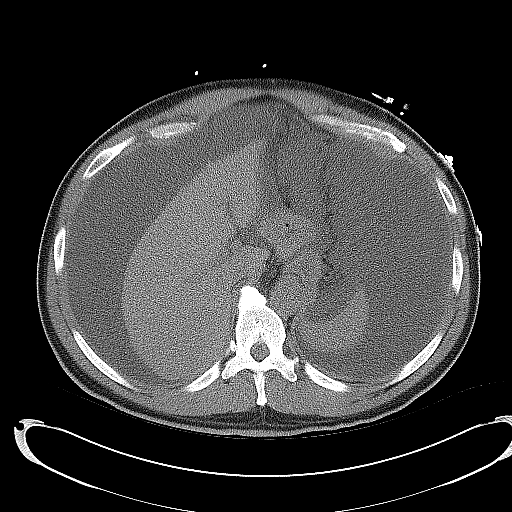
[im 6/33  bone]
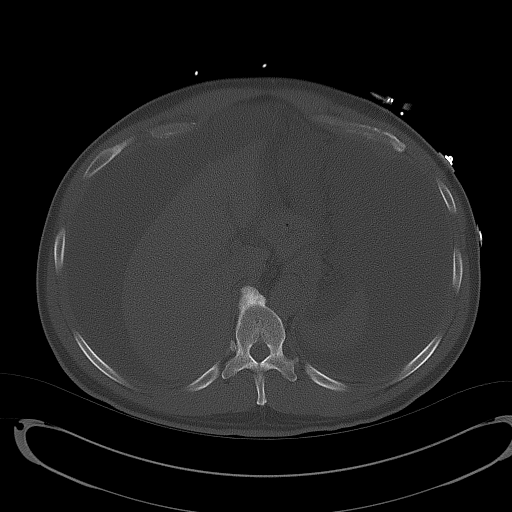
[im 11/33  soft-tissue]
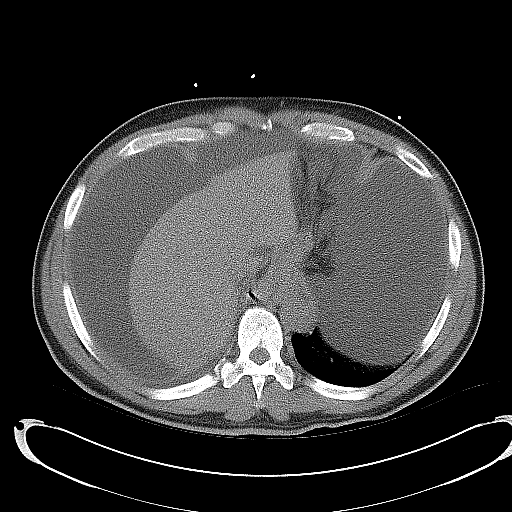
[im 11/33  lung]
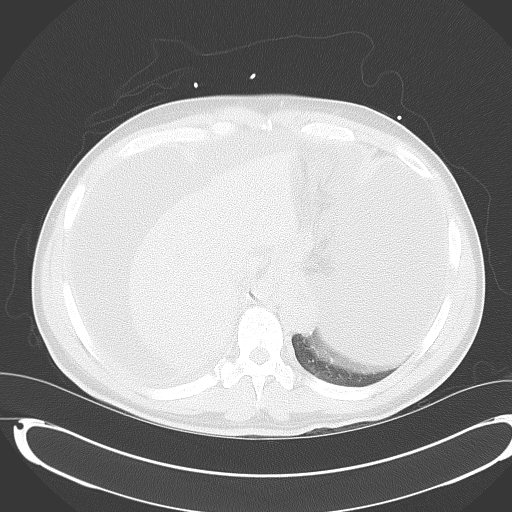
[im 17/33  soft-tissue]
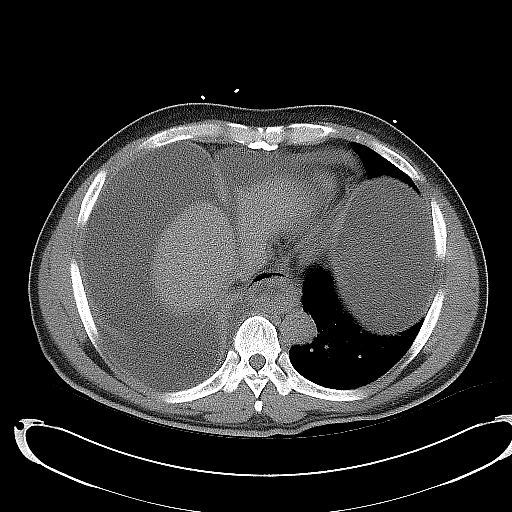
[im 17/33  lung]
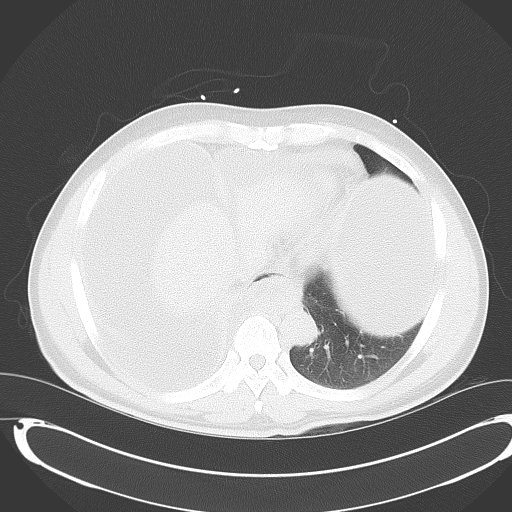
[im 22/33  soft-tissue]
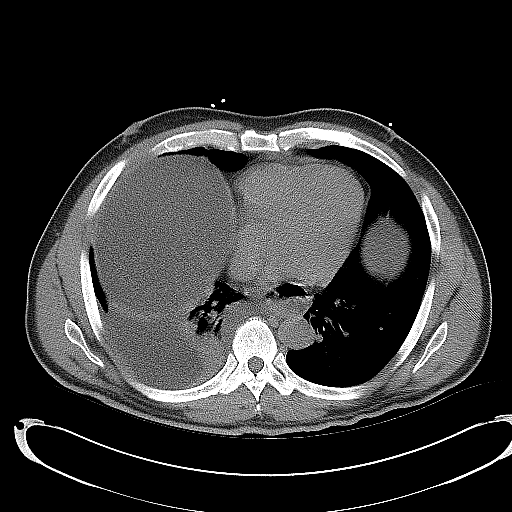
[im 22/33  lung]
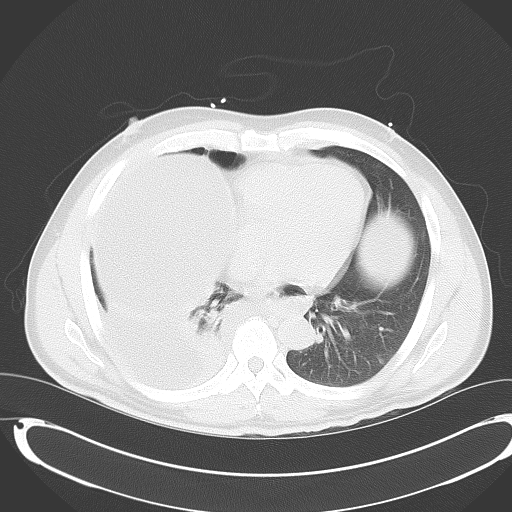
[im 27/33  soft-tissue]
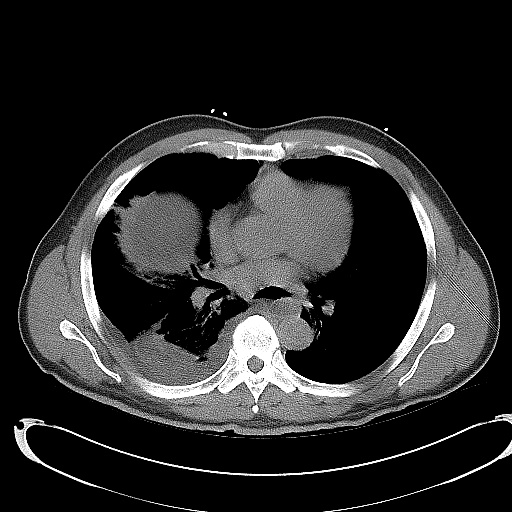
[im 27/33  lung]
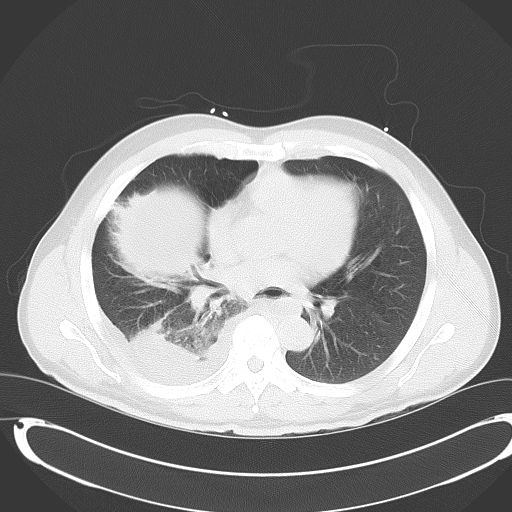

[Series 5: coronal · coronal · 0.75mm/px · 3 of 165 slices shown, 4 images]
[im 55/165  soft-tissue]
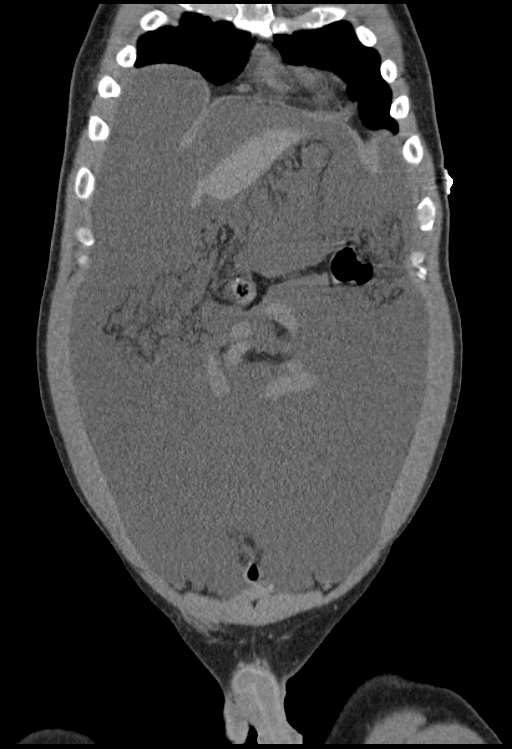
[im 73/165  soft-tissue]
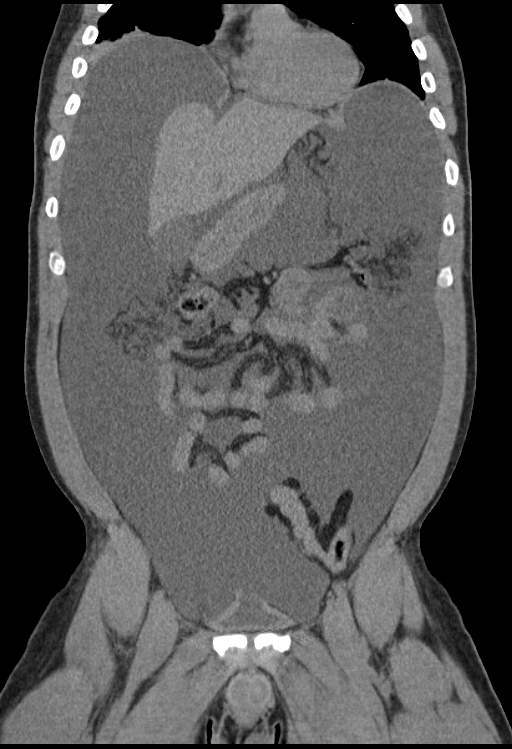
[im 73/165  bone]
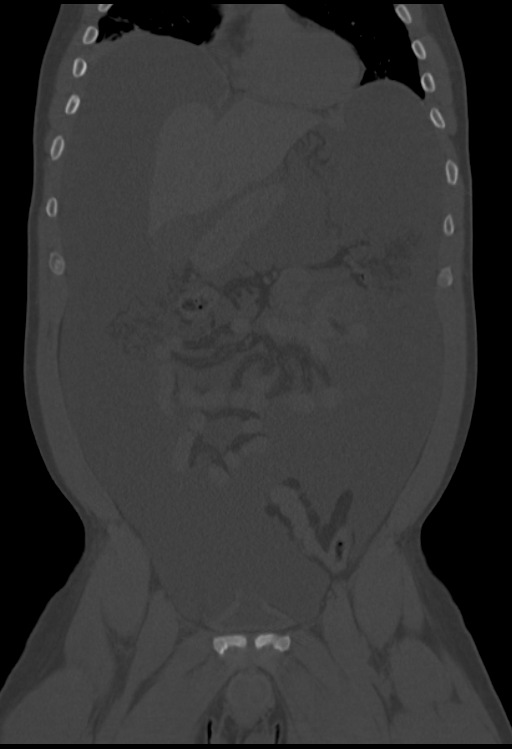
[im 92/165  soft-tissue]
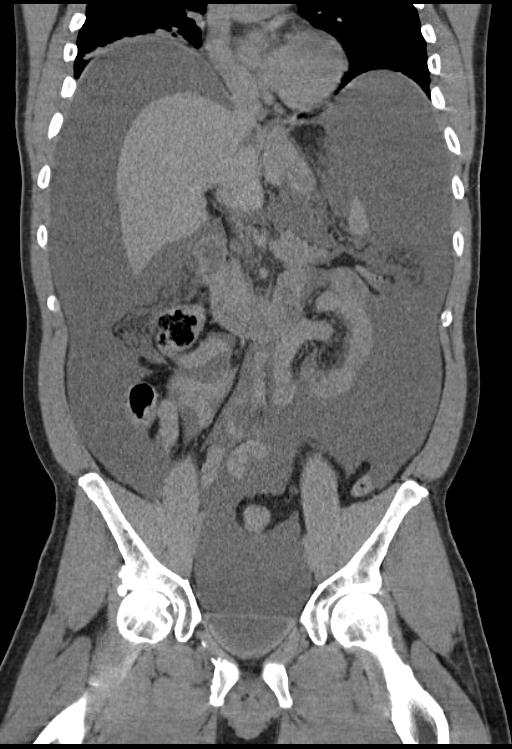

[Series 6: sagittal · sagittal · 0.64mm/px · 1 of 203 slices shown, 2 images]
[im 68/203  soft-tissue]
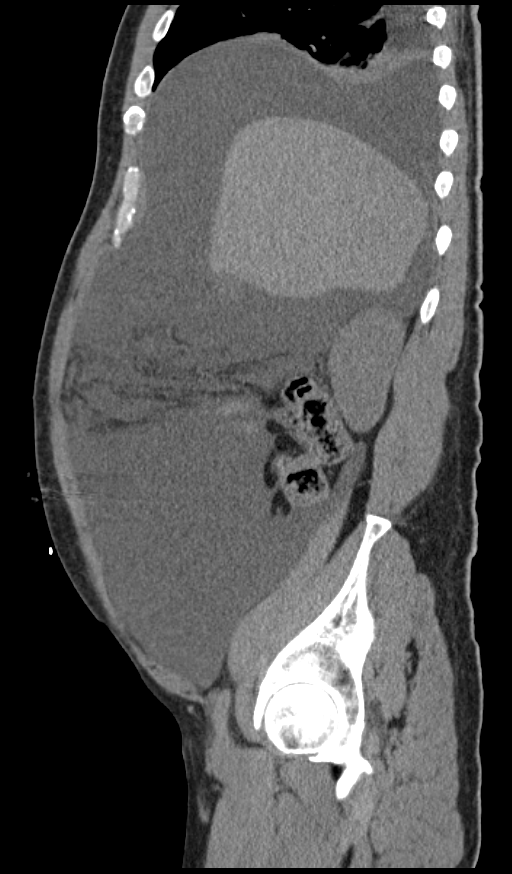
[im 68/203  bone]
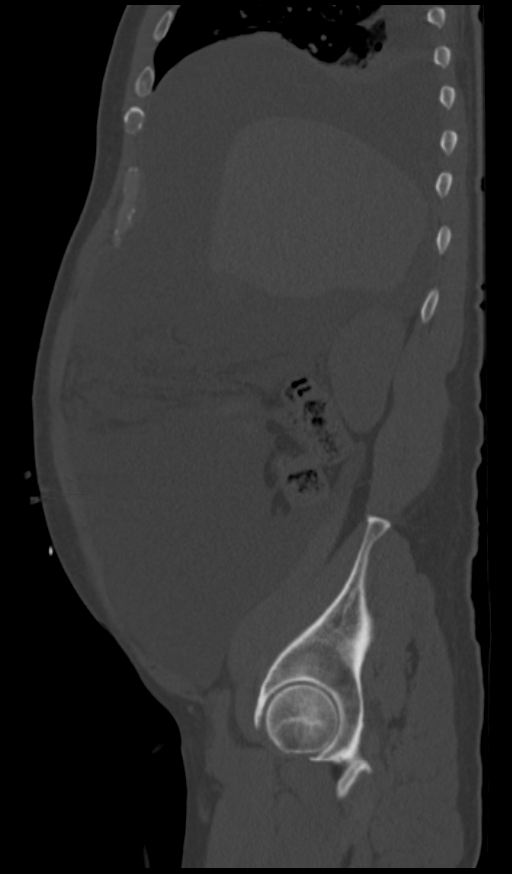

[9 of 46 positions shown; findings below may reference images not displayed]

FINDINGS: Lung bases show a small right-sided pleural effusion and right lower
lobe consolidation. A subpleural nodule is again noted in the left
lung base stable from the recent exams. A nodule in the right lung
base is also stable from the prior study.

There is a large volume of ascites identified within the abdominal
cavity and pelvis. This is increased significantly from the post
paracentesis image is obtained on 11/16/2014.

The liver, gallbladder, spleen, adrenal glands and pancreas are all
stable from the prior exam. The kidneys show no obstructive change
or renal calculi. The previously seen temporary dialysis catheter
has been removed in the interval. The bladder is partially
distended. No pelvic mass lesion or sidewall abnormality is noted.
The appendix is not visualized consistent with the prior surgical
history. No acute bony abnormality is seen.
IMPRESSION: Significant increase in the degree of ascites when compared with the
prior exam 11/16/2014. Paracentesis may be helpful in this regard.

Increase in the degree of right-sided pleural effusion and right
lower lobe consolidation.

Stable basilar nodules in the lungs bilaterally.

## 2016-01-04 ENCOUNTER — Other Ambulatory Visit: Payer: Self-pay | Admitting: Family Medicine

## 2016-01-04 DIAGNOSIS — M5441 Lumbago with sciatica, right side: Secondary | ICD-10-CM

## 2016-01-19 ENCOUNTER — Emergency Department
Admission: EM | Admit: 2016-01-19 | Discharge: 2016-01-19 | Disposition: A | Discharge status: Home / Self Care | Payer: Medicare Other | Attending: Emergency Medicine | Admitting: Emergency Medicine

## 2016-01-19 ENCOUNTER — Encounter: Payer: Self-pay | Admitting: *Deleted

## 2016-01-19 DIAGNOSIS — N50812 Left testicular pain: Secondary | ICD-10-CM | POA: Insufficient documentation

## 2016-01-19 DIAGNOSIS — R109 Unspecified abdominal pain: Secondary | ICD-10-CM | POA: Diagnosis present

## 2016-01-19 DIAGNOSIS — N50811 Right testicular pain: Secondary | ICD-10-CM | POA: Insufficient documentation

## 2016-01-19 DIAGNOSIS — F17200 Nicotine dependence, unspecified, uncomplicated: Secondary | ICD-10-CM | POA: Insufficient documentation

## 2016-01-19 HISTORY — DX: Other cystostomy status: Z93.59

## 2016-01-19 NOTE — ED Notes (Signed)
Pt came out of room asking to know where the exit is, pt states he wants to leave, he states he was already called in a RX and does not need any help, pt left ER

## 2016-01-19 NOTE — ED Notes (Signed)
Pt arrives via EMS, pt has suprapubic catheter in place and states it was changed today and since the change pt states intense pain behind his belly button and pain in his testicles, pt states he believes he has a UTI, states he is constantly on ABX for UTI, pt awake and alert upon arrival

## 2016-01-24 ENCOUNTER — Ambulatory Visit
Admission: RE | Admit: 2016-01-24 | Discharge: 2016-01-24 | Disposition: A | Discharge status: Home / Self Care | Payer: Medicare Other | Source: Ambulatory Visit | Attending: Family Medicine | Admitting: Family Medicine

## 2016-01-24 DIAGNOSIS — M5441 Lumbago with sciatica, right side: Secondary | ICD-10-CM | POA: Diagnosis not present

## 2016-01-24 DIAGNOSIS — M5136 Other intervertebral disc degeneration, lumbar region: Secondary | ICD-10-CM | POA: Insufficient documentation

## 2016-01-24 DIAGNOSIS — M4806 Spinal stenosis, lumbar region: Secondary | ICD-10-CM | POA: Insufficient documentation

## 2016-02-04 ENCOUNTER — Emergency Department: Payer: Medicare Other

## 2016-02-04 ENCOUNTER — Encounter: Payer: Self-pay | Admitting: Emergency Medicine

## 2016-02-04 ENCOUNTER — Emergency Department
Admission: EM | Admit: 2016-02-04 | Discharge: 2016-02-04 | Payer: Medicare Other | Attending: Emergency Medicine | Admitting: Emergency Medicine

## 2016-02-04 DIAGNOSIS — Y939 Activity, unspecified: Secondary | ICD-10-CM | POA: Insufficient documentation

## 2016-02-04 DIAGNOSIS — S01411A Laceration without foreign body of right cheek and temporomandibular area, initial encounter: Secondary | ICD-10-CM | POA: Diagnosis not present

## 2016-02-04 DIAGNOSIS — S0083XA Contusion of other part of head, initial encounter: Secondary | ICD-10-CM

## 2016-02-04 DIAGNOSIS — F17200 Nicotine dependence, unspecified, uncomplicated: Secondary | ICD-10-CM | POA: Diagnosis not present

## 2016-02-04 DIAGNOSIS — S0990XA Unspecified injury of head, initial encounter: Secondary | ICD-10-CM | POA: Diagnosis present

## 2016-02-04 DIAGNOSIS — S0181XA Laceration without foreign body of other part of head, initial encounter: Secondary | ICD-10-CM

## 2016-02-04 DIAGNOSIS — S32009A Unspecified fracture of unspecified lumbar vertebra, initial encounter for closed fracture: Secondary | ICD-10-CM

## 2016-02-04 DIAGNOSIS — R569 Unspecified convulsions: Secondary | ICD-10-CM | POA: Insufficient documentation

## 2016-02-04 DIAGNOSIS — S32008A Other fracture of unspecified lumbar vertebra, initial encounter for closed fracture: Secondary | ICD-10-CM | POA: Diagnosis not present

## 2016-02-04 DIAGNOSIS — Y92009 Unspecified place in unspecified non-institutional (private) residence as the place of occurrence of the external cause: Secondary | ICD-10-CM | POA: Insufficient documentation

## 2016-02-04 DIAGNOSIS — Y999 Unspecified external cause status: Secondary | ICD-10-CM | POA: Diagnosis not present

## 2016-02-04 DIAGNOSIS — Z9359 Other cystostomy status: Secondary | ICD-10-CM | POA: Diagnosis not present

## 2016-02-04 MED ORDER — DIATRIZOATE MEGLUMINE & SODIUM 66-10 % PO SOLN: 15.0000 mL | Freq: Once | ORAL | Status: DC

## 2016-02-04 MED ORDER — TRAMADOL-ACETAMINOPHEN 37.5-325 MG PO TABS: 1.0000 | ORAL_TABLET | Freq: Four times a day (QID) | ORAL | Status: DC | PRN
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
## 2016-02-04 NOTE — ED Notes (Signed)
Patient transported to CT 

## 2016-03-14 ENCOUNTER — Emergency Department
Admission: EM | Admit: 2016-03-14 | Discharge: 2016-03-15 | Disposition: A | Discharge status: Home / Self Care | Payer: Medicare Other | Attending: Emergency Medicine | Admitting: Emergency Medicine

## 2016-03-14 DIAGNOSIS — N390 Urinary tract infection, site not specified: Secondary | ICD-10-CM | POA: Diagnosis not present

## 2016-03-14 DIAGNOSIS — F1210 Cannabis abuse, uncomplicated: Secondary | ICD-10-CM | POA: Diagnosis not present

## 2016-03-14 DIAGNOSIS — Z8669 Personal history of other diseases of the nervous system and sense organs: Secondary | ICD-10-CM | POA: Insufficient documentation

## 2016-03-14 DIAGNOSIS — R1030 Lower abdominal pain, unspecified: Secondary | ICD-10-CM | POA: Diagnosis present

## 2016-03-14 DIAGNOSIS — F17200 Nicotine dependence, unspecified, uncomplicated: Secondary | ICD-10-CM | POA: Insufficient documentation

## 2016-03-14 LAB — URINALYSIS COMPLETE WITH MICROSCOPIC (ARMC ONLY)
BILIRUBIN URINE: NEGATIVE
GLUCOSE, UA: NEGATIVE mg/dL
KETONES UR: NEGATIVE mg/dL
NITRITE: NEGATIVE
PH: 7 (ref 5.0–8.0)
Protein, ur: NEGATIVE mg/dL
Specific Gravity, Urine: 1.008 (ref 1.005–1.030)
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
## 2016-03-14 NOTE — ED Notes (Signed)
Resumed care from kendall rn.  Pt voided urine and sent to lab.  Pt waiting to see er md.

## 2016-03-14 NOTE — ED Notes (Signed)
Patient has suprapubic catheter that was last changed on 02/18/2016 and is due to be changed on the 15th of this month.  Patient started presenting with severe jabbing pains in the lower abdomen.  Patient's last void was not through the bag but through his urethra and he describes it as "striaght blood".  Patient's last void was around 21:30 this evening. Upon assessment, the patient's leg bag is empty.

## 2016-03-15 ENCOUNTER — Emergency Department: Payer: Medicare Other

## 2016-03-15 DIAGNOSIS — N390 Urinary tract infection, site not specified: Secondary | ICD-10-CM | POA: Diagnosis not present

## 2016-03-15 MED ORDER — PHENAZOPYRIDINE HCL 200 MG PO TABS
200.0000 mg | ORAL_TABLET | Freq: Once | ORAL | Status: AC
  Administered 2016-03-15: 200 mg via ORAL
  Filled 2016-03-15: qty 1

## 2016-03-15 MED ORDER — MORPHINE SULFATE (PF) 4 MG/ML IV SOLN
4.0000 mg | Freq: Once | INTRAVENOUS | Status: AC
  Administered 2016-03-15: 4 mg via INTRAVENOUS
  Filled 2016-03-15: qty 1

## 2016-03-15 MED ORDER — ONDANSETRON HCL 4 MG/2ML IJ SOLN
4.0000 mg | Freq: Once | INTRAMUSCULAR | Status: AC
  Administered 2016-03-15: 4 mg via INTRAVENOUS
  Filled 2016-03-15: qty 2

## 2016-03-15 MED ORDER — CIPROFLOXACIN HCL 500 MG PO TABS: 500.0000 mg | ORAL_TABLET | Freq: Two times a day (BID) | ORAL | Status: AC

## 2016-03-15 MED ORDER — CIPROFLOXACIN HCL 500 MG PO TABS
500.0000 mg | ORAL_TABLET | Freq: Once | ORAL | Status: AC
  Administered 2016-03-15: 500 mg via ORAL
  Filled 2016-03-15: qty 1

## 2016-03-15 NOTE — ED Provider Notes (Signed)
[facility] Emergency Department Provider Note  ____________________________________________  Time seen:   I have reviewed the triage vital signs and the nursing notes.   HISTORY  Chief Complaint Urinary Retention     HPI Corey Cortez is a 50 y.o. male with history of suprapubic catheter approximately one year presents to the emergency department with decreased urinary output from the superpubic catheter as well as dysuria and hematuria since yesterday. Patient states that his current pain score is 10 out of 10 and located on the left flank/his penis. Patient states that he was playing pure blood before presentation.     Past Medical History  Diagnosis Date  . Seizures (HCC)   . Suprapubic catheter (HCC)     There are no active problems to display for this patient.   Past Surgical History  Procedure Laterality Date  . Brain surgery    . Brain surgery Right     Current Outpatient Rx  Name  Route  Sig  Dispense  Refill  . cephALEXin (KEFLEX) 500 MG capsule   Oral   Take 1 capsule (500 mg total) by mouth 4 (four) times daily.   40 capsule   0   . naproxen sodium (ANAPROX) 220 MG tablet   Oral   Take 220 mg by mouth 2 (two) times daily with a meal.         . oxybutynin (DITROPAN) 5 MG tablet   Oral   Take 5 mg by mouth 3 (three) times daily.         . traMADol-acetaminophen (ULTRACET) 37.5-325 MG tablet   Oral   Take 1 tablet by mouth every 6 (six) hours as needed.   10 tablet   0     Allergies Advil  No family history on file.  Social History Social History  Substance Use Topics  . Smoking status: Current Every Day Smoker  . Smokeless tobacco: Not on file  . Alcohol Use: Yes    Review of Systems  Constitutional: Negative for fever. Eyes: Negative for visual changes. ENT: Negative for sore throat. Cardiovascular: Negative for chest pain. Respiratory: Negative for shortness of breath. Gastrointestinal:  Negative for abdominal pain, vomiting and diarrhea. Genitourinary: Positive for dysuria and hematuria Musculoskeletal: Negative for back pain. Skin: Negative for rash. Neurological: Negative for headaches, focal weakness or numbness.   10-point ROS otherwise negative.  ____________________________________________   PHYSICAL EXAM:  VITAL SIGNS: ED Triage Vitals  Enc Vitals Group     BP 03/14/16 2150 150/77 mmHg     Pulse Rate 03/14/16 2150 77     Resp 03/14/16 2150 18     Temp 03/14/16 2150 97.9 F (36.6 C)     Temp Source 03/14/16 2150 Oral     SpO2 03/14/16 2150 99 %     Weight 03/14/16 2150 176 lb (79.833 kg)     Height 03/14/16 2150  (1.753 m)     Head Cir --      Peak Flow --      Pain Score 03/14/16 2153 10     Pain Loc --      Pain Edu? --      Excl. in GC? --     Constitutional: Alert and oriented. Well appearing and in no distress. Eyes: Conjunctivae are normal. PERRL. Normal extraocular movements. ENT   Head: Normocephalic and atraumatic.   Nose: No congestion/rhinnorhea.   Mouth/Throat: Mucous membranes are moist.   Neck: No stridor. Hematological/Lymphatic/Immunilogical: No cervical lymphadenopathy.  Cardiovascular: Normal rate, regular rhythm. Normal and symmetric distal pulses are present in all extremities. No murmurs, rubs, or gallops. Respiratory: Normal respiratory effort without tachypnea nor retractions. Breath sounds are clear and equal bilaterally. No wheezes/rales/rhonchi. Gastrointestinal: Soft and nontender. No distention. There is no CVA tenderness. Genitourinary: Clear urine noted in the patient's suprapubic catheter leg bag. Musculoskeletal: Nontender with normal range of motion in all extremities. No joint effusions.  No lower extremity tenderness nor edema. Neurologic:  Normal speech and language. No gross focal neurologic deficits are appreciated. Speech is normal.  Skin:  Skin is warm, dry and intact. No rash  noted. Psychiatric: Mood and affect are normal. Speech and behavior are normal. Patient exhibits appropriate insight and judgment.  ____________________________________________    LABS (pertinent positives/negatives)  Labs Reviewed  URINALYSIS COMPLETEWITH MICROSCOPIC ([facility] ONLY) - Abnormal; Notable for the following:    Color, Urine YELLOW (*)    APPearance CLEAR (*)    Hgb urine dipstick 1+ (*)    Leukocytes, UA 3+ (*)    Bacteria, UA RARE (*)    Squamous Epithelial / LPF 0-5 (*)    All other components within normal limits      RADIOLOGY  Renal Stone Study (Final result) Result time: 03/15/16 01:11:30   Final result by Rad Results In Interface (03/15/16 01:11:30)   Narrative:   CLINICAL DATA: Severe stabbing lower abdominal pain, with hematuria. Indwelling suprapubic catheter due for changing Mar 19, 2016. Evaluate LEFT flank pain and hematuria.  EXAM: CT ABDOMEN AND PELVIS WITHOUT CONTRAST  TECHNIQUE: Multidetector CT imaging of the abdomen and pelvis was performed following the standard protocol without IV contrast.  COMPARISON: CT abdomen and pelvis February 04, 2016 and CT abdomen and pelvis December 17, 2014.  FINDINGS: LUNG BASES: 3 mm stable RIGHT lower lobe sub solid subpleural pulmonary nodule is likely benign. Stable 6 mm LEFT lower lobe pulmonary nodule in addition to 2-3 mm ground-glass nodules.  KIDNEYS/BLADDER: Kidneys are orthotopic, demonstrating normal size and morphology. No nephrolithiasis, hydronephrosis; limited assessment for renal masses on this nonenhanced examination. The unopacified ureters are normal in course and caliber. Urinary bladder is partially distended containing suprapubic catheter, no intravesicular calculi. Punctate densities within the catheter. Similar bladder wall thickening catheter insertion.  SOLID ORGANS: The liver, spleen, gallbladder, pancreas and RIGHT adrenal gland are unremarkable for this non-contrast  examination. Stable 12 x 20 mm LEFT adrenal nodule.  GASTROINTESTINAL TRACT: The stomach, small and large bowel are normal in course and caliber without inflammatory changes, the sensitivity may be decreased by lack of enteric contrast. Moderate amount of retained large bowel stool. Normal appendix.  PERITONEUM/RETROPERITONEUM: Aortoiliac vessels are normal in course and caliber, mild calcific atherosclerosis. No lymphadenopathy by CT size criteria. Similar prostatomegaly deforming base of bladder. No intraperitoneal free fluid nor free air.  SOFT TISSUES/ OSSEOUS STRUCTURES: Nonsuspicious. Healing nondisplaced RIGHT L2 through L4 transverse process fractures.  IMPRESSION: No nephrolithiasis or obstructive uropathy. Partially distended urinary bladder with suprapubic catheter in place. Density within the catheter can be seen with blood products or less likely calculi.  Stable LEFT adrenal nodule.   Electronically Signed By: Awilda Metro M.D. On: 03/15/2016 01:11        INITIAL IMPRESSION / ASSESSMENT AND PLAN / ED COURSE  Pertinent labs & imaging results that were available during my care of the patient were reviewed by me and considered in my medical decision making (see chart for details).  Patient's superglue catheter irrigated with normal saline clear urine  obtained following irrigation. Patient received Cipro 500 mg will be prescribed same for home patient advised to follow-up with Dr. Apolinar JunesBrandon allergist if suprapubic catheter becomes obstructed again.  ____________________________________________   FINAL CLINICAL IMPRESSION(S) / ED DIAGNOSES  Final diagnoses:  UTI (lower urinary tract infection)      Darci Currentandolph N Lundon Verdejo, MD 03/15/16 [phone]

## 2016-03-15 NOTE — ED Notes (Signed)
Bladder scan 238 [facility]  Dr brown aware.

## 2016-03-15 NOTE — Discharge Instructions (Signed)

## 2016-03-16 ENCOUNTER — Encounter: Payer: Self-pay | Admitting: Emergency Medicine

## 2016-03-16 ENCOUNTER — Emergency Department
Admission: EM | Admit: 2016-03-16 | Discharge: 2016-03-16 | Discharge status: Against Medical Advice | Payer: Medicare Other | Attending: Emergency Medicine | Admitting: Emergency Medicine

## 2016-03-16 DIAGNOSIS — Y828 Other medical devices associated with adverse incidents: Secondary | ICD-10-CM | POA: Diagnosis not present

## 2016-03-16 DIAGNOSIS — R109 Unspecified abdominal pain: Secondary | ICD-10-CM

## 2016-03-16 DIAGNOSIS — Z8669 Personal history of other diseases of the nervous system and sense organs: Secondary | ICD-10-CM | POA: Insufficient documentation

## 2016-03-16 DIAGNOSIS — T83198A Other mechanical complication of other urinary devices and implants, initial encounter: Secondary | ICD-10-CM | POA: Diagnosis present

## 2016-03-16 DIAGNOSIS — F1290 Cannabis use, unspecified, uncomplicated: Secondary | ICD-10-CM | POA: Insufficient documentation

## 2016-03-16 DIAGNOSIS — N390 Urinary tract infection, site not specified: Secondary | ICD-10-CM | POA: Diagnosis not present

## 2016-03-16 DIAGNOSIS — F17200 Nicotine dependence, unspecified, uncomplicated: Secondary | ICD-10-CM | POA: Insufficient documentation

## 2016-03-16 DIAGNOSIS — Z792 Long term (current) use of antibiotics: Secondary | ICD-10-CM | POA: Diagnosis not present

## 2016-03-16 LAB — URINALYSIS COMPLETE WITH MICROSCOPIC (ARMC ONLY)
BILIRUBIN URINE: NEGATIVE
Bacteria, UA: NONE SEEN
GLUCOSE, UA: NEGATIVE mg/dL
Ketones, ur: NEGATIVE mg/dL
Nitrite: NEGATIVE
Protein, ur: NEGATIVE mg/dL
Specific Gravity, Urine: 1.012 (ref 1.005–1.030)
pH: 7 (ref 5.0–8.0)

## 2016-03-16 MED ORDER — CEPHALEXIN 500 MG PO CAPS
500.0000 mg | ORAL_CAPSULE | Freq: Once | ORAL | Status: DC
  Filled 2016-03-16: qty 1

## 2016-03-16 MED ORDER — MORPHINE SULFATE (PF) 4 MG/ML IV SOLN
4.0000 mg | Freq: Once | INTRAVENOUS | Status: DC
  Filled 2016-03-16: qty 1
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
## 2016-03-16 NOTE — ED Notes (Signed)
Patient also refused bladder scan.

## 2016-03-16 NOTE — ED Notes (Signed)
At patient's bedside discussing plan of care.  Patient states "I am afraid of needles and I want my pain medication through an IV".  Discussed that Dr. Lenard LancePaduchowski ordered Morphine to be given IM and ordered a blood draw as well.  Patient became load, using profanity and refused morphine and blood draw.  When clarifying with patient that we were trying to give him pain medication and check his infection levels patient began screaming, "I just want this ... Tube out and I want you to take it out".  Again reviewed immediate plan of care and patient stated that he was refusing further treatment.  Patient continued to use a load voice and profanity.  Patient up and out of bed getting dressed, continuing to yell profanities at this RN.  RN stepped out of the room, Dr. Lenard LancePaduchowski informed that patient was refusing treatment and signing out AMA.  Security also called to assist with AMA discharge.  Patient stated he was refusing to sign AMA paperwork but verbally stated understanding that he was leaving the ED AMA.  Patient walked out of room yelling that he was leaving this hospital.  Security notified that patient was in the waiting area.
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
## 2016-04-11 ENCOUNTER — Emergency Department (HOSPITAL_COMMUNITY)
Admission: EM | Admit: 2016-04-11 | Discharge: 2016-04-11 | Discharge status: Against Medical Advice | Payer: Medicare Other | Attending: Emergency Medicine | Admitting: Emergency Medicine

## 2016-04-11 ENCOUNTER — Encounter (HOSPITAL_COMMUNITY): Payer: Self-pay | Admitting: Emergency Medicine

## 2016-04-11 DIAGNOSIS — Z5321 Procedure and treatment not carried out due to patient leaving prior to being seen by health care provider: Secondary | ICD-10-CM | POA: Diagnosis not present

## 2016-04-11 DIAGNOSIS — Z5320 Procedure and treatment not carried out because of patient's decision for unspecified reasons: Secondary | ICD-10-CM

## 2016-04-11 DIAGNOSIS — Z79899 Other long term (current) drug therapy: Secondary | ICD-10-CM | POA: Insufficient documentation

## 2016-04-11 DIAGNOSIS — Z5329 Procedure and treatment not carried out because of patient's decision for other reasons: Secondary | ICD-10-CM

## 2016-04-11 DIAGNOSIS — Z9359 Other cystostomy status: Secondary | ICD-10-CM

## 2016-04-11 DIAGNOSIS — F17200 Nicotine dependence, unspecified, uncomplicated: Secondary | ICD-10-CM | POA: Diagnosis not present

## 2016-04-11 DIAGNOSIS — Y732 Prosthetic and other implants, materials and accessory gastroenterology and urology devices associated with adverse incidents: Secondary | ICD-10-CM | POA: Diagnosis not present

## 2016-04-11 DIAGNOSIS — T83031A Leakage of indwelling urethral catheter, initial encounter: Secondary | ICD-10-CM | POA: Diagnosis not present

## 2016-04-11 NOTE — ED Notes (Addendum)
Pt with suprapubic catheter c/o blood in urine, bleeding out of rectum and painful urination x2 months. Pt states he wants suprapubic catheter removed. Pt also reports recurrent infections since the catheter was placed 1 year ago.
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
## 2016-04-11 NOTE — ED Notes (Signed)
Pt acted disgruntled and asked for exit

## 2016-04-11 NOTE — ED Provider Notes (Signed)
CSN: [id number]     Arrival date & time 04/11/16  1418 History   First MD Initiated Contact with Patient 04/11/16 1449     Chief Complaint  Patient presents with  . Dysuria  . Abdominal Pain     (Consider location/radiation/quality/duration/timing/severity/associated sxs/prior Treatment) Patient is a 50 y.o. male presenting with abdominal pain. The history is provided by the patient.  Abdominal Pain Pain location:  Suprapubic Pain quality: aching   Pain radiates to:  Does not radiate Pain severity:  Moderate Onset quality:  Gradual Duration: 2 months. Timing:  Constant Progression:  Unchanged Chronicity:  Chronic Context comment:  Suprapubic catheter in place Relieved by:  Nothing Worsened by:  Nothing tried Ineffective treatments:  None tried Associated symptoms: hematuria   Associated symptoms: no anorexia and no fever   Associated symptoms comment:  Leaking of urine from penis   Past Medical History  Diagnosis Date  . Seizures (HCC)   . Suprapubic catheter [facility])    Past Surgical History  Procedure Laterality Date  . Brain surgery    . Brain surgery Right    History reviewed. No pertinent family history. Social History  Substance Use Topics  . Smoking status: Current Every Day Smoker  . Smokeless tobacco: None  . Alcohol Use: Yes    Review of Systems  Constitutional: Negative for fever.  Gastrointestinal: Positive for abdominal pain. Negative for anorexia.  Genitourinary: Positive for hematuria.  All other systems reviewed and are negative.     Allergies  Advil  Home Medications   Prior to Admission medications   Medication Sig Start Date End Date Taking? Authorizing Provider  cephALEXin (KEFLEX) 500 MG capsule Take 1 capsule (500 mg total) by mouth 4 (four) times daily. 08/26/15   Sharyn Creamer, MD  naproxen sodium (ANAPROX) 220 MG tablet Take 220 mg by mouth 2 (two) times daily with a meal.    Historical Provider, MD  oxybutynin (DITROPAN) 5 MG  tablet Take 5 mg by mouth 3 (three) times daily.    Historical Provider, MD  traMADol-acetaminophen (ULTRACET) 37.5-325 MG tablet Take 1 tablet by mouth every 6 (six) hours as needed. 02/04/16   Jeanmarie Plant, MD   BP 114/79 mmHg  Pulse 72  Temp(Src) 98 F (36.7 C) (Oral)  Resp 17  SpO2 100% Physical Exam  Constitutional: He is oriented to person, place, and time. He appears well-developed and well-nourished. No distress.  HENT:  Head: Normocephalic and atraumatic.  Eyes: Conjunctivae are normal.  Neck: Neck supple. No tracheal deviation present.  Cardiovascular: Normal rate and regular rhythm.   Pulmonary/Chest: Effort normal. No respiratory distress.  Abdominal: Soft. He exhibits no distension.  Neurological: He is alert and oriented to person, place, and time.  Skin: Skin is warm and dry.  Psychiatric: His affect is angry.    ED Course  Procedures (including critical care time) Labs Review Labs Reviewed - No data to display  Imaging Review No results found. I have personally reviewed and evaluated these images and lab results as part of my medical decision-making.   EKG Interpretation None      MDM   Final diagnoses:  Left against medical advice  Suprapubic catheter [facility])    50 year old male presents with request for removal of suprapubic catheter which she has had in place for over a year. I explained to the patient that emergency department does not routinely remove suprapubic catheters. He is not complaining of any fevers or systemic infection symptoms. The Foley  catheter appears to be patent and is continuing to drain. He states that it causes him significant pain and is demanding "a urologist right now to take this thing out, put me to sleep and sew me up". I explained to the patient that I cannot consult a urologist without an emergent indication to which he said "you're not going to help me, I'm leaving". The patient eloped from the emergency department after  being offered screening for infection or other life-threatening emergency to which he refused. I told him I was happy to provide him information for how to obtain an outpatient urology consultation and he refused paperwork and eloped from the emergency department.    Lyndal Pulleyaniel Meosha Castanon, MD 04/11/16 1501

## 2016-04-11 NOTE — ED Notes (Signed)
Patient walked through the ER as he was leaving and began yelling loudly about how he was leaving because "HELL, YALL AIN'T GONNA HELP ME." Then punched the automatic door opening button, and proceeded out into the lobby continuing to yell vulgar things loudly about the staff and the care we provide here. Walked out the ER shortly after.

## 2017-03-07 ENCOUNTER — Emergency Department
Admission: EM | Admit: 2017-03-07 | Discharge: 2017-03-07 | Discharge status: Against Medical Advice | Payer: Medicare Other | Attending: Student in an Organized Health Care Education/Training Program | Admitting: Student in an Organized Health Care Education/Training Program

## 2017-03-07 ENCOUNTER — Emergency Department: Payer: Medicare Other

## 2017-03-07 ENCOUNTER — Emergency Department
Admission: EM | Admit: 2017-03-07 | Discharge: 2017-03-07 | Discharge status: Against Medical Advice | Payer: Medicare Other | Source: Home / Self Care | Attending: Emergency Medicine | Admitting: Emergency Medicine

## 2017-03-07 ENCOUNTER — Encounter: Payer: Self-pay | Admitting: Emergency Medicine

## 2017-03-07 ENCOUNTER — Encounter: Payer: Self-pay | Admitting: Intensive Care

## 2017-03-07 DIAGNOSIS — Z79899 Other long term (current) drug therapy: Secondary | ICD-10-CM | POA: Insufficient documentation

## 2017-03-07 DIAGNOSIS — R339 Retention of urine, unspecified: Secondary | ICD-10-CM | POA: Diagnosis present

## 2017-03-07 DIAGNOSIS — R319 Hematuria, unspecified: Secondary | ICD-10-CM | POA: Insufficient documentation

## 2017-03-07 DIAGNOSIS — F17210 Nicotine dependence, cigarettes, uncomplicated: Secondary | ICD-10-CM | POA: Insufficient documentation

## 2017-03-07 DIAGNOSIS — R102 Pelvic and perineal pain: Secondary | ICD-10-CM

## 2017-03-07 DIAGNOSIS — N3090 Cystitis, unspecified without hematuria: Secondary | ICD-10-CM | POA: Diagnosis not present

## 2017-03-07 DIAGNOSIS — R1084 Generalized abdominal pain: Secondary | ICD-10-CM | POA: Insufficient documentation

## 2017-03-07 DIAGNOSIS — R338 Other retention of urine: Secondary | ICD-10-CM

## 2017-03-07 LAB — BASIC METABOLIC PANEL
Anion gap: 10 (ref 5–15)
BUN: 13 mg/dL (ref 6–20)
CHLORIDE: 101 mmol/L (ref 101–111)
CO2: 24 mmol/L (ref 22–32)
Calcium: 9.1 mg/dL (ref 8.9–10.3)
Creatinine, Ser: 1.34 mg/dL — ABNORMAL HIGH (ref 0.61–1.24)
GFR calc Af Amer: >60 mL/min (ref >60)
GFR, EST NON AFRICAN AMERICAN: 60 mL/min — AB (ref >60)
GLUCOSE: 90 mg/dL (ref 65–99)
POTASSIUM: 3.8 mmol/L (ref 3.5–5.1)
Sodium: 135 mmol/L (ref 135–145)

## 2017-03-07 LAB — CBC WITH DIFFERENTIAL/PLATELET
Basophils Absolute: 0 10*3/uL (ref 0–0.1)
Basophils Relative: 0 %
Eosinophils Absolute: 0 10*3/uL (ref 0–0.7)
Eosinophils Relative: 0 %
HEMATOCRIT: 40.9 % (ref 40.0–52.0)
Hemoglobin: 13.9 g/dL (ref 13.0–18.0)
LYMPHS ABS: 0.6 10*3/uL — AB (ref 1.0–3.6)
LYMPHS PCT: 6 %
MCH: 29.6 pg (ref 26.0–34.0)
MCHC: 33.9 g/dL (ref 32.0–36.0)
MCV: 87.3 fL (ref 80.0–100.0)
Monocytes Absolute: 0.5 10*3/uL (ref 0.2–1.0)
Monocytes Relative: 6 %
NEUTROS ABS: 8.2 10*3/uL — AB (ref 1.4–6.5)
NEUTROS PCT: 88 %
PLATELETS: 153 10*3/uL (ref 150–440)
RBC: 4.69 MIL/uL (ref 4.40–5.90)
RDW: 13.6 % (ref 11.5–14.5)
WBC: 9.4 10*3/uL (ref 3.8–10.6)

## 2017-03-07 MED ORDER — LIDOCAINE HCL 2 % EX GEL
1.0000 | Freq: Once | CUTANEOUS | Status: DC
Start: 2017-03-07 — End: 2017-03-07

## 2017-03-07 MED ORDER — MORPHINE SULFATE (PF) 4 MG/ML IV SOLN: 4.0000 mg | Freq: Once | INTRAVENOUS | Status: DC

## 2017-03-07 MED ORDER — HALOPERIDOL 5 MG PO TABS
10.0000 mg | ORAL_TABLET | Freq: Once | ORAL | Status: AC
  Administered 2017-03-07: 10 mg via ORAL

## 2017-03-07 MED ORDER — SODIUM CHLORIDE 0.9 % IV BOLUS (SEPSIS): 500.0000 mL | Freq: Once | INTRAVENOUS | Status: DC

## 2017-03-07 MED ORDER — HALOPERIDOL 5 MG PO TABS
5.0000 mg | ORAL_TABLET | Freq: Once | ORAL | Status: DC
  Filled 2017-03-07: qty 1

## 2017-03-07 MED ORDER — TAMSULOSIN HCL 0.4 MG PO CAPS
0.8000 mg | ORAL_CAPSULE | Freq: Once | ORAL | Status: AC
  Administered 2017-03-07: 0.8 mg via ORAL
  Filled 2017-03-07: qty 2

## 2017-03-07 MED ORDER — ONDANSETRON HCL 4 MG/2ML IJ SOLN: 4.0000 mg | Freq: Once | INTRAMUSCULAR | Status: DC

## 2017-03-07 MED ORDER — TAMSULOSIN HCL 0.4 MG PO CAPS: 0.8000 mg | ORAL_CAPSULE | Freq: Every day | ORAL | 0 refills | Status: DC

## 2017-03-07 MED ORDER — CIPROFLOXACIN HCL 500 MG PO TABS: 500.0000 mg | ORAL_TABLET | Freq: Two times a day (BID) | ORAL | 0 refills | Status: AC

## 2017-03-07 NOTE — ED Provider Notes (Signed)
[facility] Emergency Department Provider Note    First MD Initiated Contact with Patient 03/07/17 1654     (approximate)  I have reviewed the triage vital signs and the nursing notes.   HISTORY  Chief Complaint Abdominal Pain    HPI [name] is a 51 y.o. male who presents shortly after eloping from the ER for persistent suprapubic abdominal pain. Patient was belligerent and aggressive towards staff. Patient states the pain is in his suprapubic region radiating through to his back. Denies any fevers. Patient was seen ambulating down the hallway when eloping.  Patient does have a long urological history status post suprapubic catheter that was removed roughly 1 year ago.  States that last night he started passing blood in his urine and has had difficulty emptying his bladder since last night.   Past Medical History:  Diagnosis Date  . Seizures (HCC)   . Suprapubic catheter (HCC)    No family history on file. Past Surgical History:  Procedure Laterality Date  . BRAIN SURGERY    . BRAIN SURGERY Right    There are no active problems to display for this patient.     Prior to Admission medications   Medication Sig Start Date End Date Taking? Authorizing Provider  cephALEXin (KEFLEX) 500 MG capsule Take 1 capsule (500 mg total) by mouth 4 (four) times daily. 08/26/15   Sharyn Creamer, MD  naproxen sodium (ANAPROX) 220 MG tablet Take 220 mg by mouth 2 (two) times daily with a meal.    Historical Provider, MD  oxybutynin (DITROPAN) 5 MG tablet Take 5 mg by mouth 3 (three) times daily.    Historical Provider, MD  traMADol-acetaminophen (ULTRACET) 37.5-325 MG tablet Take 1 tablet by mouth every 6 (six) hours as needed. 02/04/16   Jeanmarie Plant, MD    Allergies Advil [ibuprofen]    Social History Social History  Substance Use Topics  . Smoking status: Current Every Day Smoker    Packs/day: 0.50    Types: Cigarettes  . Smokeless tobacco: Never  Used  . Alcohol use No    Review of Systems Patient denies headaches, rhinorrhea, blurry vision, numbness, shortness of breath, chest pain, edema, cough, abdominal pain, nausea, vomiting, diarrhea, dysuria, fevers, rashes or hallucinations unless otherwise stated above in HPI. ____________________________________________   PHYSICAL EXAM:  VITAL SIGNS: Vitals:   03/07/17 1658  BP: 133/77  Pulse: 77  Resp: 18    Constitutional: agitated, uncomfortable appearing but in NAD Eyes: Conjunctivae are normal. PERRL. EOMI. Head: Atraumatic. Nose: No congestion/rhinnorhea. Mouth/Throat: Mucous membranes are moist.  Oropharynx non-erythematous. Neck: No stridor. Painless ROM. No cervical spine tenderness to palpation Hematological/Lymphatic/Immunilogical: No cervical lymphadenopathy. Cardiovascular: Normal rate, regular rhythm. Grossly normal heart sounds.  Good peripheral circulation. Respiratory: Normal respiratory effort.  No retractions. Lungs CTAB. Gastrointestinal: distended suprapubic region ttp.  No CVA ttp.  Normal bowelsounds Genitourinary: normal external genitalia Musculoskeletal: No lower extremity tenderness nor edema.  No joint effusions. Neurologic:  Normal speech and language. No gross focal neurologic deficits are appreciated.  Skin:  Skin is warm, dry and intact. No rash noted. Psychiatric: agitated ____________________________________________   LABS (all labs ordered are listed, but only abnormal results are displayed)  Results for orders placed or performed during the hospital encounter of 03/07/17 (from the past 24 hour(s))  CBC with Differential/Platelet     Status: Abnormal   Collection Time: 03/07/17  5:33 PM  Result Value Ref Range   WBC 9.4 3.8 -  10.6 K/uL   RBC 4.69 4.40 - 5.90 MIL/uL   Hemoglobin 13.9 13.0 - 18.0 g/dL   HCT 78.240.9 95.640.0 - 21.352.0 %   MCV 87.3 80.0 - 100.0 fL   MCH 29.6 26.0 - 34.0 pg   MCHC 33.9 32.0 - 36.0 g/dL   RDW 08.613.6 57.811.5 - 46.914.5 %    Platelets 153 150 - 440 K/uL   Neutrophils Relative % 88 %   Neutro Abs 8.2 (H) 1.4 - 6.5 K/uL   Lymphocytes Relative 6 %   Lymphs Abs 0.6 (L) 1.0 - 3.6 K/uL   Monocytes Relative 6 %   Monocytes Absolute 0.5 0.2 - 1.0 K/uL   Eosinophils Relative 0 %   Eosinophils Absolute 0.0 0 - 0.7 K/uL   Basophils Relative 0 %   Basophils Absolute 0.0 0 - 0.1 K/uL  Basic metabolic panel     Status: Abnormal   Collection Time: 03/07/17  5:33 PM  Result Value Ref Range   Sodium 135 135 - 145 mmol/L   Potassium 3.8 3.5 - 5.1 mmol/L   Chloride 101 101 - 111 mmol/L   CO2 24 22 - 32 mmol/L   Glucose, Bld 90 65 - 99 mg/dL   BUN 13 6 - 20 mg/dL   Creatinine, Ser 6.291.34 (H) 0.61 - 1.24 mg/dL   Calcium 9.1 8.9 - 52.810.3 mg/dL   GFR calc non Af Amer 60 (L) >60 mL/min   GFR calc Af Amer >60 >60 mL/min   Anion gap 10 5 - 15   ____________________________________________  EKG ____________________________________________  RADIOLOGY  EMERGENCY DEPARTMENT ULTRASOUND  Study: Limited Ultrasound of Bladder  INDICATIONS: to assess for urinary retention and/or bladder volume prior to urinary catheter Multiple views of the bladder were obtained in real-time in the transverse and longitudinal planes with a multi-frequency probe.  PERFORMED BY: Myself IMAGES ARCHIVED?: No LIMITATIONS:  none INTERPRETATION: Large Volume   I personally reviewed all radiographic images ordered to evaluate for the above acute complaints and reviewed radiology reports and findings.  These findings were personally discussed with the patient.  Please see medical record for radiology report.     ____________________________________________   PROCEDURES  Procedure(s) performed:  Procedures    Critical Care performed: no ____________________________________________   INITIAL IMPRESSION / ASSESSMENT AND PLAN / ED COURSE  Pertinent labs & imaging results that were available during my care of the patient were reviewed by me  and considered in my medical decision making (see chart for details).  DDX: urinary retention, obstruction, heamturia, infection  Corey Cortez is a 51 y.o. who presents to the ED with suprapubic pain as described above. Patient is AFVSS in ED. Exam as above. Given current presentation have considered the above differential.  Patient is very agitated and uncooperative with staff. His presentation given his bedside ultrasound is concerning for acute urinary retention. I explained to the patient that he will need a catheterization and probable Foley catheter to alleviate his pain. Patient has refused "anything coming near me."  I splinted the patient that that is the only way to alleviate his pain and that his discomfort is coming from a distended bladder that unless he is able to empty his bladder on his own will not improved.  Patient states "you're going to have to put me to sleep " to insert a Foley. I explained to the patient that due to the risks of sedation for an otherwise benign procedure that it would not be ethical or appropriate to  do that at this time. We'll provide Flomax as well as pain medication.    ----------------------------------------- 6:33 PM on 03/07/2017 -----------------------------------------  I went to bedside to inform patient that it is in my medical opinion that the patient will require a catheter to decompress his bladder. Patient has again stated that he will refuse any sort of catheterization unless he is sedated for this procedure. I informed him that I do not feel that his appropriate are clinically indicated to perform a procedural sedation for this procedure. Patient states that he just wants something for antibiotics and Flomax and to go home. I did inform patient that he would be leaving AGAINST MEDICAL ADVICE.  I talked to the patient at length and carefully explained, in layman's terms, that the standard of care is to insert, at the very least an in and out  foley for bladder decompression and that by leaving against medical advice they not allowing [place] to treat their acute medical conditions according to our best medical judgement, and that a serious adverse outcome including increased pain, suffering, short- or long-term disability and even death, could result. I also explained to the patient that we respect their point-of-view and are not angry. I made sure that they understood that they can, and should, return at any time if they change their mind. This conversation was witnessed by The patient's father. The patient indicated understanding of our conversation but still desired to leave against medical advice. I deemed the patient to be of sound mind to make this decision and demonstrate understanding of our conversation.  Will provide Rx for cystitis and flomax.  ____________________________________________   FINAL CLINICAL IMPRESSION(S) / ED DIAGNOSES  Final diagnoses:  Acute urinary retention  Suprapubic abdominal pain  Cystitis      NEW MEDICATIONS STARTED DURING THIS VISIT:  New Prescriptions   No medications on file     Note:  This document was prepared using Dragon voice recognition software and may include unintentional dictation errors.    Willy Eddy, MD 03/07/17 [id number]

## 2017-03-07 NOTE — ED Triage Notes (Signed)
Pt comes into the ED via POV c/o lower abdominal pain.  Patient presents with tenderness to the lower abdomen and rectal pain.  Patient left earlier from ER as AMA patient.  Patient is uncooperative at this time and agitated.  Patient in NAD at this time with even and unlabored respirations.

## 2017-03-07 NOTE — ED Notes (Signed)
Patient is refusing any protocols to be done at this time until he in no longer in pain

## 2017-03-07 NOTE — ED Notes (Signed)
This RN attempted to start IV x 1, pt was jerking away and yelling at this RN. Pt then began cursing at this time stating, "you ain't no goddamn good". This RN left room and got charge RN. Charge RN to bedside, pt states "[facility] Dr. Don PerkingVeronese made aware, Noreene LarssonJill, RN explained to patient that he would be leaving AMA, pt states "I ain't signing no goddamn paperwork". Dr. Don PerkingVeronese attempt to convince patient to stay, pt ambulatory to the lobby without difficutly, refused to stay.

## 2017-03-07 NOTE — ED Notes (Signed)
This RN notified by Dr. Roxan Hockeyobinson to D/C patient against medical advice. This RN explained to patient the risks of leaving AMA, pt states understanding, pt also states "take this thing out of my arm so I can go, y'all aren't the best, you aren't the only one and you aren't the best". Pt ambulatory to the lobby after IV removed with his family. NAD noted.

## 2017-03-07 NOTE — ED Triage Notes (Signed)
FIRST NURSE: Per ems pt from home with abd pain started 4am, all over. States passed blood in urine today.  VSS. EKG WNL. Pt brought out to lobby in recliner.
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
## 2017-03-07 NOTE — ED Notes (Signed)
Bladder scanned . Pt tolerated poorly, yelling in pain. This RN explained necessity at this time.

## 2017-03-07 NOTE — ED Triage Notes (Addendum)
Arrived by EMS from home. Patient reports he had a suprapubic tube removed last July by [facility] and he is now experiencing extreme pain. No drainage noted by this RN at this time. Reports burning upon urination and urinating dark red blood X24 hours. A&O x4. Patient denies doing drugs and ETOH. Denies SI/HI. Patient verbally aggressive in triage demanding pain medicine

## 2018-01-23 ENCOUNTER — Other Ambulatory Visit: Payer: Self-pay | Admitting: Neurology

## 2018-01-23 DIAGNOSIS — G40911 Epilepsy, unspecified, intractable, with status epilepticus: Secondary | ICD-10-CM

## 2018-04-08 ENCOUNTER — Other Ambulatory Visit: Payer: Self-pay | Admitting: Internal Medicine

## 2018-04-08 ENCOUNTER — Ambulatory Visit
Admission: RE | Admit: 2018-04-08 | Discharge: 2018-04-08 | Disposition: A | Discharge status: Home / Self Care | Payer: Medicare Other | Source: Ambulatory Visit | Attending: Internal Medicine | Admitting: Internal Medicine

## 2018-04-08 DIAGNOSIS — R1031 Right lower quadrant pain: Secondary | ICD-10-CM

## 2018-04-08 HISTORY — DX: Essential (primary) hypertension: I10

## 2018-04-08 MED ORDER — IOPAMIDOL (ISOVUE-300) INJECTION 61%
100.0000 mL | Freq: Once | INTRAVENOUS | Status: AC | PRN
  Administered 2018-04-08: 100 mL via INTRAVENOUS

## 2018-04-15 ENCOUNTER — Ambulatory Visit
Admission: RE | Admit: 2018-04-15 | Discharge: 2018-04-15 | Disposition: A | Discharge status: Home / Self Care | Payer: Medicare Other | Source: Ambulatory Visit | Attending: Internal Medicine | Admitting: Internal Medicine

## 2018-04-15 DIAGNOSIS — R1031 Right lower quadrant pain: Secondary | ICD-10-CM | POA: Diagnosis present

## 2018-04-15 DIAGNOSIS — N323 Diverticulum of bladder: Secondary | ICD-10-CM | POA: Diagnosis not present

## 2018-04-15 MED ORDER — IOPAMIDOL (ISOVUE-300) INJECTION 61%
100.0000 mL | Freq: Once | INTRAVENOUS | Status: AC | PRN
  Administered 2018-04-15: 100 mL via INTRAVENOUS

## 2018-05-05 ENCOUNTER — Other Ambulatory Visit: Payer: Self-pay | Admitting: Neurology

## 2018-05-05 DIAGNOSIS — G40909 Epilepsy, unspecified, not intractable, without status epilepticus: Secondary | ICD-10-CM

## 2018-05-12 ENCOUNTER — Ambulatory Visit: Payer: Medicare Other

## 2018-05-16 ENCOUNTER — Ambulatory Visit: Payer: Medicare Other

## 2018-05-23 ENCOUNTER — Ambulatory Visit
Admission: RE | Admit: 2018-05-23 | Discharge: 2018-05-23 | Disposition: A | Discharge status: Home / Self Care | Payer: Medicare Other | Source: Ambulatory Visit | Attending: Neurology | Admitting: Neurology

## 2018-08-22 ENCOUNTER — Other Ambulatory Visit: Payer: Self-pay | Admitting: Internal Medicine

## 2018-08-22 DIAGNOSIS — R1310 Dysphagia, unspecified: Secondary | ICD-10-CM

## 2018-08-29 ENCOUNTER — Ambulatory Visit
Admission: RE | Admit: 2018-08-29 | Discharge: 2018-08-29 | Disposition: A | Discharge status: Home / Self Care | Payer: Medicare Other | Source: Ambulatory Visit | Attending: Internal Medicine | Admitting: Internal Medicine

## 2018-08-29 DIAGNOSIS — R1310 Dysphagia, unspecified: Secondary | ICD-10-CM | POA: Diagnosis not present

## 2018-08-29 DIAGNOSIS — K209 Esophagitis, unspecified: Secondary | ICD-10-CM | POA: Diagnosis not present

## 2018-11-11 ENCOUNTER — Encounter: Admission: RE | Payer: Self-pay | Source: Home / Self Care

## 2018-11-11 ENCOUNTER — Ambulatory Visit: Admission: RE | Admit: 2018-11-11 | Payer: Medicare Other | Source: Home / Self Care | Admitting: Internal Medicine

## 2018-11-11 SURGERY — EGD (ESOPHAGOGASTRODUODENOSCOPY)
Anesthesia: General

## 2018-11-19 ENCOUNTER — Emergency Department: Payer: Medicare Other

## 2018-11-19 ENCOUNTER — Emergency Department
Admission: EM | Admit: 2018-11-19 | Discharge: 2018-11-19 | Disposition: A | Discharge status: Home / Self Care | Payer: Medicare Other | Attending: Emergency Medicine | Admitting: Emergency Medicine

## 2018-11-19 ENCOUNTER — Other Ambulatory Visit: Payer: Self-pay

## 2018-11-19 DIAGNOSIS — R319 Hematuria, unspecified: Secondary | ICD-10-CM | POA: Insufficient documentation

## 2018-11-19 DIAGNOSIS — F17210 Nicotine dependence, cigarettes, uncomplicated: Secondary | ICD-10-CM | POA: Insufficient documentation

## 2018-11-19 DIAGNOSIS — I10 Essential (primary) hypertension: Secondary | ICD-10-CM | POA: Diagnosis not present

## 2018-11-19 DIAGNOSIS — Z79899 Other long term (current) drug therapy: Secondary | ICD-10-CM | POA: Diagnosis not present

## 2018-11-19 DIAGNOSIS — R300 Dysuria: Secondary | ICD-10-CM

## 2018-11-19 DIAGNOSIS — F10929 Alcohol use, unspecified with intoxication, unspecified: Secondary | ICD-10-CM | POA: Diagnosis not present

## 2018-11-19 DIAGNOSIS — R350 Frequency of micturition: Secondary | ICD-10-CM | POA: Diagnosis present

## 2018-11-19 LAB — URINE DRUG SCREEN, QUALITATIVE (ARMC ONLY)
Amphetamines, Ur Screen: NOT DETECTED
BARBITURATES, UR SCREEN: NOT DETECTED
Benzodiazepine, Ur Scrn: NOT DETECTED
Cannabinoid 50 Ng, Ur ~~LOC~~: POSITIVE — AB
Cocaine Metabolite,Ur ~~LOC~~: POSITIVE — AB
MDMA (Ecstasy)Ur Screen: NOT DETECTED
Methadone Scn, Ur: NOT DETECTED
Opiate, Ur Screen: NOT DETECTED
Phencyclidine (PCP) Ur S: NOT DETECTED
Tricyclic, Ur Screen: NOT DETECTED

## 2018-11-19 LAB — URINALYSIS, COMPLETE (UACMP) WITH MICROSCOPIC
BACTERIA UA: NONE SEEN
Bilirubin Urine: NEGATIVE
GLUCOSE, UA: NEGATIVE mg/dL
Hgb urine dipstick: NEGATIVE
KETONES UR: NEGATIVE mg/dL
LEUKOCYTES UA: NEGATIVE
Nitrite: NEGATIVE
PROTEIN: NEGATIVE mg/dL
Specific Gravity, Urine: 1.015 (ref 1.005–1.030)
pH: 5 (ref 5.0–8.0)

## 2018-11-19 LAB — CHLAMYDIA/NGC RT PCR (ARMC ONLY)
Chlamydia Tr: NOT DETECTED
N gonorrhoeae: NOT DETECTED

## 2018-11-19 MED ORDER — KETOROLAC TROMETHAMINE 30 MG/ML IJ SOLN
30.0000 mg | Freq: Once | INTRAMUSCULAR | Status: AC
  Administered 2018-11-19: 30 mg via INTRAVENOUS

## 2018-11-19 MED ORDER — ONDANSETRON HCL 4 MG/2ML IJ SOLN
4.0000 mg | Freq: Once | INTRAMUSCULAR | Status: AC
  Administered 2018-11-19: 4 mg via INTRAVENOUS
  Filled 2018-11-19: qty 2

## 2018-11-19 MED ORDER — PHENAZOPYRIDINE HCL 100 MG PO TABS: 100.0000 mg | ORAL_TABLET | Freq: Three times a day (TID) | ORAL | 0 refills | Status: AC | PRN

## 2018-11-19 MED ORDER — SODIUM CHLORIDE 0.9 % IV BOLUS
1000.0000 mL | Freq: Once | INTRAVENOUS | Status: AC
  Administered 2018-11-19: 1000 mL via INTRAVENOUS

## 2018-11-19 MED ORDER — KETOROLAC TROMETHAMINE 30 MG/ML IJ SOLN
30.0000 mg | Freq: Once | INTRAMUSCULAR | Status: DC
  Filled 2018-11-19: qty 1

## 2018-11-19 MED ORDER — PHENAZOPYRIDINE HCL 200 MG PO TABS
200.0000 mg | ORAL_TABLET | Freq: Once | ORAL | Status: AC
  Administered 2018-11-19: 200 mg via ORAL
  Filled 2018-11-19: qty 1

## 2018-11-19 MED ORDER — CEPHALEXIN 500 MG PO CAPS: 500.0000 mg | ORAL_CAPSULE | Freq: Two times a day (BID) | ORAL | 0 refills | Status: AC
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
## 2018-11-19 NOTE — ED Triage Notes (Addendum)
Pt arrives to ED via [facility] from home with c/o urinary frequency, hematuria, and groin pain x2 days. Pt reports h/x of UTIs and reports current s/x's feel similar. Pt denies N/V/D. Pt does admit to ETOH and marijuana use PTA.

## 2018-11-23 LAB — ETHANOL: Alcohol, Ethyl (B): <10 mg/dL (ref <10)

## 2019-01-31 ENCOUNTER — Emergency Department: Payer: Medicare Other

## 2019-01-31 ENCOUNTER — Other Ambulatory Visit: Payer: Self-pay

## 2019-01-31 ENCOUNTER — Emergency Department
Admission: EM | Admit: 2019-01-31 | Discharge: 2019-01-31 | Disposition: A | Discharge status: Home / Self Care | Payer: Medicare Other | Attending: Emergency Medicine | Admitting: Emergency Medicine

## 2019-01-31 ENCOUNTER — Encounter: Payer: Self-pay | Admitting: Radiology

## 2019-01-31 DIAGNOSIS — Z79899 Other long term (current) drug therapy: Secondary | ICD-10-CM | POA: Insufficient documentation

## 2019-01-31 DIAGNOSIS — F17210 Nicotine dependence, cigarettes, uncomplicated: Secondary | ICD-10-CM | POA: Diagnosis not present

## 2019-01-31 DIAGNOSIS — I10 Essential (primary) hypertension: Secondary | ICD-10-CM | POA: Insufficient documentation

## 2019-01-31 DIAGNOSIS — N3000 Acute cystitis without hematuria: Secondary | ICD-10-CM | POA: Diagnosis not present

## 2019-01-31 DIAGNOSIS — N3090 Cystitis, unspecified without hematuria: Secondary | ICD-10-CM

## 2019-01-31 DIAGNOSIS — R300 Dysuria: Secondary | ICD-10-CM | POA: Diagnosis present

## 2019-01-31 LAB — CBC WITH DIFFERENTIAL/PLATELET
Abs Immature Granulocytes: 0.01 10*3/uL (ref 0.00–0.07)
Basophils Absolute: 0 10*3/uL (ref 0.0–0.1)
Basophils Relative: 0 %
EOS PCT: 2 %
Eosinophils Absolute: 0.1 10*3/uL (ref 0.0–0.5)
HCT: 42.8 % (ref 39.0–52.0)
Hemoglobin: 14.8 g/dL (ref 13.0–17.0)
Immature Granulocytes: 0 %
LYMPHS PCT: 26 %
Lymphs Abs: 1.2 10*3/uL (ref 0.7–4.0)
MCH: 30.8 pg (ref 26.0–34.0)
MCHC: 34.6 g/dL (ref 30.0–36.0)
MCV: 89.2 fL (ref 80.0–100.0)
Monocytes Absolute: 0.4 10*3/uL (ref 0.1–1.0)
Monocytes Relative: 8 %
Neutro Abs: 3.1 10*3/uL (ref 1.7–7.7)
Neutrophils Relative %: 64 %
Platelets: 164 10*3/uL (ref 150–400)
RBC: 4.8 MIL/uL (ref 4.22–5.81)
RDW: 12 % (ref 11.5–15.5)
WBC: 4.8 10*3/uL (ref 4.0–10.5)
nRBC: 0 % (ref 0.0–0.2)

## 2019-01-31 LAB — URINALYSIS, COMPLETE (UACMP) WITH MICROSCOPIC
Bacteria, UA: NONE SEEN
Specific Gravity, Urine: 1.015 (ref 1.005–1.030)

## 2019-01-31 LAB — COMPREHENSIVE METABOLIC PANEL
ALT: 21 U/L (ref 0–44)
AST: 25 U/L (ref 15–41)
Albumin: 4.4 g/dL (ref 3.5–5.0)
Alkaline Phosphatase: 83 U/L (ref 38–126)
Anion gap: 8 (ref 5–15)
BUN: 17 mg/dL (ref 6–20)
CO2: 22 mmol/L (ref 22–32)
Calcium: 8.9 mg/dL (ref 8.9–10.3)
Chloride: 106 mmol/L (ref 98–111)
Creatinine, Ser: 1.29 mg/dL — ABNORMAL HIGH (ref 0.61–1.24)
GFR calc Af Amer: >60 mL/min (ref >60)
GFR calc non Af Amer: >60 mL/min (ref >60)
Glucose, Bld: 136 mg/dL — ABNORMAL HIGH (ref 70–99)
Potassium: 3.6 mmol/L (ref 3.5–5.1)
Sodium: 136 mmol/L (ref 135–145)
Total Bilirubin: 0.5 mg/dL (ref 0.3–1.2)
Total Protein: 7.2 g/dL (ref 6.5–8.1)

## 2019-01-31 LAB — CHLAMYDIA/NGC RT PCR (ARMC ONLY)
Chlamydia Tr: NOT DETECTED
N gonorrhoeae: NOT DETECTED

## 2019-01-31 MED ORDER — OXYCODONE-ACETAMINOPHEN 5-325 MG PO TABS
1.0000 | ORAL_TABLET | Freq: Once | ORAL | Status: AC
  Administered 2019-01-31: 1 via ORAL
  Filled 2019-01-31: qty 1

## 2019-01-31 MED ORDER — SULFAMETHOXAZOLE-TRIMETHOPRIM 800-160 MG PO TABS
1.0000 | ORAL_TABLET | Freq: Once | ORAL | Status: AC
  Administered 2019-01-31: 1 via ORAL
  Filled 2019-01-31: qty 1

## 2019-01-31 MED ORDER — SULFAMETHOXAZOLE-TRIMETHOPRIM 800-160 MG PO TABS: 1.0000 | ORAL_TABLET | Freq: Two times a day (BID) | ORAL | 0 refills | Status: AC

## 2019-01-31 MED ORDER — OXYCODONE-ACETAMINOPHEN 5-325 MG PO TABS: 1.0000 | ORAL_TABLET | ORAL | 0 refills | Status: DC | PRN

## 2019-01-31 MED ORDER — FENTANYL CITRATE (PF) 100 MCG/2ML IJ SOLN
50.0000 ug | Freq: Once | INTRAMUSCULAR | Status: AC
  Administered 2019-01-31: 50 ug via INTRAVENOUS
  Filled 2019-01-31: qty 2

## 2019-01-31 MED ORDER — IOHEXOL 300 MG/ML  SOLN
100.0000 mL | Freq: Once | INTRAMUSCULAR | Status: AC | PRN
  Administered 2019-01-31: 100 mL via INTRAVENOUS

## 2019-01-31 MED ORDER — ONDANSETRON HCL 4 MG/2ML IJ SOLN
4.0000 mg | Freq: Once | INTRAMUSCULAR | Status: AC
  Administered 2019-01-31: 4 mg via INTRAVENOUS
  Filled 2019-01-31: qty 2
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
## 2019-01-31 NOTE — ED Notes (Signed)
Attempted IV x1. Vein blew. Pt doesn't keep arm still. States he is afraid of needles.
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
## 2019-01-31 NOTE — ED Notes (Signed)
attempted to start IV and drawn blood work and attempted to bladder scan patient to obtain Post Void Residual.  Patient refusing at this time due to pain.  Patient unable to state where pain is.

## 2019-01-31 NOTE — ED Notes (Signed)
Officers/security at bedside with this RN to help d/c pt. Pt refusing vitals.

## 2019-01-31 NOTE — ED Triage Notes (Signed)
Pt states that he is having abd pain, dysuria and pain with bowel movements, states that he has a hx of having a suprapubic catheter in the past

## 2019-01-31 NOTE — ED Notes (Signed)
Patient transported to CT 

## 2019-01-31 NOTE — ED Notes (Signed)
Bladder scan done by this tech. Volume 

## 2019-01-31 NOTE — ED Notes (Signed)
Pt requesting more pain meds. EDP Don Perking order to hold on any pain meds until CT comes back.

## 2019-01-31 NOTE — ED Notes (Signed)
Pt given pain med, food tray, and ginerale as requested.

## 2019-01-31 NOTE — ED Notes (Signed)
This RN in to room to answer call light. Pt states he needs pain meds. This RN attempted to ask the pt where he was hurting so that doctor could be informed. Pt became belligerent yelling to get the doctor into the room This RN ask pt to stop cursing and yelling. Pt states he will do as he wishes. This RN stepped out of the room to inform MD.

## 2019-01-31 NOTE — ED Notes (Signed)
Pt refuses to sign for d/c paperwork. Educated about d/c info.

## 2019-06-29 ENCOUNTER — Other Ambulatory Visit: Payer: Self-pay | Admitting: Sports Medicine

## 2019-06-29 DIAGNOSIS — M7541 Impingement syndrome of right shoulder: Secondary | ICD-10-CM

## 2019-06-29 DIAGNOSIS — G8929 Other chronic pain: Secondary | ICD-10-CM

## 2019-07-15 ENCOUNTER — Ambulatory Visit
Admission: RE | Admit: 2019-07-15 | Discharge: 2019-07-15 | Disposition: A | Discharge status: Home / Self Care | Payer: Medicare Other | Source: Ambulatory Visit | Attending: Sports Medicine | Admitting: Sports Medicine

## 2019-07-15 ENCOUNTER — Other Ambulatory Visit: Payer: Self-pay

## 2019-07-15 DIAGNOSIS — G8929 Other chronic pain: Secondary | ICD-10-CM | POA: Diagnosis present

## 2019-07-15 DIAGNOSIS — M25511 Pain in right shoulder: Secondary | ICD-10-CM | POA: Diagnosis present

## 2019-07-15 DIAGNOSIS — M7541 Impingement syndrome of right shoulder: Secondary | ICD-10-CM

## 2019-09-02 ENCOUNTER — Other Ambulatory Visit: Payer: Self-pay | Admitting: Surgery

## 2019-09-10 ENCOUNTER — Observation Stay: Payer: Medicare Other

## 2019-09-10 ENCOUNTER — Other Ambulatory Visit: Payer: Self-pay

## 2019-09-10 ENCOUNTER — Emergency Department: Payer: Medicare Other

## 2019-09-10 ENCOUNTER — Observation Stay
Admission: EM | Admit: 2019-09-10 | Discharge: 2019-09-11 | Disposition: A | Discharge status: Home / Self Care | Payer: Medicare Other | Attending: Internal Medicine | Admitting: Internal Medicine

## 2019-09-10 DIAGNOSIS — E663 Overweight: Secondary | ICD-10-CM | POA: Diagnosis present

## 2019-09-10 DIAGNOSIS — N179 Acute kidney failure, unspecified: Principal | ICD-10-CM | POA: Diagnosis present

## 2019-09-10 DIAGNOSIS — R338 Other retention of urine: Secondary | ICD-10-CM | POA: Diagnosis not present

## 2019-09-10 DIAGNOSIS — F17210 Nicotine dependence, cigarettes, uncomplicated: Secondary | ICD-10-CM | POA: Insufficient documentation

## 2019-09-10 DIAGNOSIS — R0602 Shortness of breath: Secondary | ICD-10-CM

## 2019-09-10 DIAGNOSIS — N319 Neuromuscular dysfunction of bladder, unspecified: Secondary | ICD-10-CM | POA: Diagnosis not present

## 2019-09-10 DIAGNOSIS — Z79899 Other long term (current) drug therapy: Secondary | ICD-10-CM | POA: Diagnosis not present

## 2019-09-10 DIAGNOSIS — R339 Retention of urine, unspecified: Secondary | ICD-10-CM | POA: Diagnosis present

## 2019-09-10 DIAGNOSIS — R188 Other ascites: Secondary | ICD-10-CM | POA: Diagnosis not present

## 2019-09-10 DIAGNOSIS — Z886 Allergy status to analgesic agent status: Secondary | ICD-10-CM | POA: Insufficient documentation

## 2019-09-10 DIAGNOSIS — I10 Essential (primary) hypertension: Secondary | ICD-10-CM | POA: Diagnosis present

## 2019-09-10 DIAGNOSIS — Z20828 Contact with and (suspected) exposure to other viral communicable diseases: Secondary | ICD-10-CM | POA: Insufficient documentation

## 2019-09-10 DIAGNOSIS — F1910 Other psychoactive substance abuse, uncomplicated: Secondary | ICD-10-CM | POA: Diagnosis present

## 2019-09-10 LAB — CBC WITH DIFFERENTIAL/PLATELET
Abs Immature Granulocytes: 0 10*3/uL (ref 0.00–0.07)
Basophils Absolute: 0 10*3/uL (ref 0.0–0.1)
Basophils Relative: 0 %
Eosinophils Absolute: 0.1 10*3/uL (ref 0.0–0.5)
Eosinophils Relative: 2 %
HCT: 48.7 % (ref 39.0–52.0)
Hemoglobin: 15.7 g/dL (ref 13.0–17.0)
Immature Granulocytes: 0 %
Lymphocytes Relative: 34 %
Lymphs Abs: 1.8 10*3/uL (ref 0.7–4.0)
MCH: 29.4 pg (ref 26.0–34.0)
MCHC: 32.2 g/dL (ref 30.0–36.0)
MCV: 91.2 fL (ref 80.0–100.0)
Monocytes Absolute: 0.6 10*3/uL (ref 0.1–1.0)
Monocytes Relative: 10 %
Neutro Abs: 2.9 10*3/uL (ref 1.7–7.7)
Neutrophils Relative %: 54 %
Platelets: 143 10*3/uL — ABNORMAL LOW (ref 150–400)
RBC: 5.34 MIL/uL (ref 4.22–5.81)
RDW: 13.5 % (ref 11.5–15.5)
WBC: 5.5 10*3/uL (ref 4.0–10.5)
nRBC: 0 % (ref 0.0–0.2)

## 2019-09-10 LAB — URINALYSIS, COMPLETE (UACMP) WITH MICROSCOPIC
Bacteria, UA: NONE SEEN
Bilirubin Urine: NEGATIVE
Glucose, UA: NEGATIVE mg/dL
Ketones, ur: NEGATIVE mg/dL
Leukocytes,Ua: NEGATIVE
Nitrite: NEGATIVE
Protein, ur: NEGATIVE mg/dL
Specific Gravity, Urine: 1.014 (ref 1.005–1.030)
Squamous Epithelial / HPF: NONE SEEN (ref 0–5)
pH: 5 (ref 5.0–8.0)

## 2019-09-10 LAB — URINE DRUG SCREEN, QUALITATIVE (ARMC ONLY)
Amphetamines, Ur Screen: NOT DETECTED
Barbiturates, Ur Screen: NOT DETECTED
Benzodiazepine, Ur Scrn: POSITIVE — AB
Cannabinoid 50 Ng, Ur ~~LOC~~: POSITIVE — AB
Cocaine Metabolite,Ur ~~LOC~~: POSITIVE — AB
MDMA (Ecstasy)Ur Screen: NOT DETECTED
Methadone Scn, Ur: NOT DETECTED
Opiate, Ur Screen: POSITIVE — AB
Phencyclidine (PCP) Ur S: NOT DETECTED
Tricyclic, Ur Screen: NOT DETECTED

## 2019-09-10 LAB — COMPREHENSIVE METABOLIC PANEL
ALT: 29 U/L (ref 0–44)
AST: 31 U/L (ref 15–41)
Albumin: 4.4 g/dL (ref 3.5–5.0)
Alkaline Phosphatase: 79 U/L (ref 38–126)
Anion gap: 11 (ref 5–15)
BUN: 18 mg/dL (ref 6–20)
CO2: 23 mmol/L (ref 22–32)
Calcium: 9.3 mg/dL (ref 8.9–10.3)
Chloride: 104 mmol/L (ref 98–111)
Creatinine, Ser: 3.26 mg/dL — ABNORMAL HIGH (ref 0.61–1.24)
GFR calc Af Amer: 24 mL/min — ABNORMAL LOW (ref >60)
GFR calc non Af Amer: 20 mL/min — ABNORMAL LOW (ref >60)
Glucose, Bld: 147 mg/dL — ABNORMAL HIGH (ref 70–99)
Potassium: 4.6 mmol/L (ref 3.5–5.1)
Sodium: 138 mmol/L (ref 135–145)
Total Bilirubin: 1 mg/dL (ref 0.3–1.2)
Total Protein: 8.1 g/dL (ref 6.5–8.1)

## 2019-09-10 LAB — PROTIME-INR
INR: 1 (ref 0.8–1.2)
Prothrombin Time: 13.4 seconds (ref 11.4–15.2)

## 2019-09-10 LAB — PROCALCITONIN: Procalcitonin: <0.1 ng/mL

## 2019-09-10 LAB — CK: Total CK: 253 U/L (ref 49–397)

## 2019-09-10 LAB — HIV ANTIBODY (ROUTINE TESTING W REFLEX): HIV Screen 4th Generation wRfx: NONREACTIVE

## 2019-09-10 LAB — LIPASE, BLOOD: Lipase: 53 U/L — ABNORMAL HIGH (ref 11–51)

## 2019-09-10 LAB — SARS CORONAVIRUS 2 (TAT 6-24 HRS): SARS Coronavirus 2: NEGATIVE

## 2019-09-10 LAB — CREATININE, URINE, RANDOM: Creatinine, Urine: 193 mg/dL

## 2019-09-10 LAB — SODIUM, URINE, RANDOM: Sodium, Ur: 95 mmol/L

## 2019-09-10 LAB — ETHANOL: Alcohol, Ethyl (B): <10 mg/dL (ref <10)

## 2019-09-10 MED ORDER — FENTANYL CITRATE (PF) 100 MCG/2ML IJ SOLN
INTRAMUSCULAR | Status: AC | PRN
  Administered 2019-09-10 (×2): 50 ug via INTRAVENOUS

## 2019-09-10 MED ORDER — MORPHINE SULFATE (PF) 2 MG/ML IV SOLN
2.0000 mg | Freq: Once | INTRAVENOUS | Status: AC
  Administered 2019-09-10: 2 mg via INTRAVENOUS
  Filled 2019-09-10: qty 1

## 2019-09-10 MED ORDER — MIDAZOLAM HCL 2 MG/2ML IJ SOLN
INTRAMUSCULAR | Status: AC | PRN
  Administered 2019-09-10 (×2): 1 mg via INTRAVENOUS
  Administered 2019-09-10: 2 mg via INTRAVENOUS

## 2019-09-10 MED ORDER — ALBUTEROL SULFATE (2.5 MG/3ML) 0.083% IN NEBU: 2.5000 mg | INHALATION_SOLUTION | Freq: Four times a day (QID) | RESPIRATORY_TRACT | Status: DC | PRN

## 2019-09-10 MED ORDER — ACETAMINOPHEN 650 MG RE SUPP: 650.0000 mg | Freq: Four times a day (QID) | RECTAL | Status: DC | PRN

## 2019-09-10 MED ORDER — LACTATED RINGERS IV SOLN
INTRAVENOUS | Status: AC
  Administered 2019-09-10 – 2019-09-11 (×3): via INTRAVENOUS

## 2019-09-10 MED ORDER — MIDAZOLAM HCL 2 MG/2ML IJ SOLN
INTRAMUSCULAR | Status: AC
  Administered 2019-09-10: 2 mg via INTRAVENOUS
  Filled 2019-09-10: qty 2

## 2019-09-10 MED ORDER — MIDAZOLAM HCL 2 MG/2ML IJ SOLN
2.0000 mg | Freq: Once | INTRAMUSCULAR | Status: AC
  Administered 2019-09-10: 09:00:00 2 mg via INTRAVENOUS
  Filled 2019-09-10: qty 2

## 2019-09-10 MED ORDER — MIDAZOLAM HCL 5 MG/5ML IJ SOLN
INTRAMUSCULAR | Status: AC
  Administered 2019-09-10: 1 mg via INTRAVENOUS
  Filled 2019-09-10: qty 5

## 2019-09-10 MED ORDER — SODIUM CHLORIDE 0.9 % IV BOLUS
1000.0000 mL | Freq: Once | INTRAVENOUS | Status: AC
  Administered 2019-09-10: 1000 mL via INTRAVENOUS

## 2019-09-10 MED ORDER — CEFAZOLIN SODIUM-DEXTROSE 1-4 GM/50ML-% IV SOLN
INTRAVENOUS | Status: AC | PRN
  Administered 2019-09-10: 2 g via INTRAVENOUS

## 2019-09-10 MED ORDER — MIDAZOLAM HCL 2 MG/2ML IJ SOLN
INTRAMUSCULAR | Status: AC | PRN
  Administered 2019-09-10: 1 mg via INTRAVENOUS

## 2019-09-10 MED ORDER — LORAZEPAM 2 MG/ML IJ SOLN
1.0000 mg | Freq: Once | INTRAMUSCULAR | Status: AC
  Administered 2019-09-10: 1 mg via INTRAVENOUS
  Filled 2019-09-10: qty 1

## 2019-09-10 MED ORDER — MORPHINE SULFATE (PF) 2 MG/ML IV SOLN
1.0000 mg | Freq: Once | INTRAVENOUS | Status: AC
  Administered 2019-09-10: 1 mg via INTRAVENOUS
  Filled 2019-09-10: qty 1

## 2019-09-10 MED ORDER — HYDROCODONE-ACETAMINOPHEN 5-325 MG PO TABS
1.0000 | ORAL_TABLET | ORAL | Status: DC | PRN
  Administered 2019-09-10 – 2019-09-11 (×3): 1 via ORAL
  Filled 2019-09-10 (×3): qty 1

## 2019-09-10 MED ORDER — ONDANSETRON HCL 4 MG PO TABS: 4.0000 mg | ORAL_TABLET | Freq: Four times a day (QID) | ORAL | Status: DC | PRN

## 2019-09-10 MED ORDER — PNEUMOCOCCAL VAC POLYVALENT 25 MCG/0.5ML IJ INJ
0.5000 mL | INJECTION | INTRAMUSCULAR | Status: DC
  Filled 2019-09-10: qty 0.5

## 2019-09-10 MED ORDER — ONDANSETRON HCL 4 MG/2ML IJ SOLN: 4.0000 mg | Freq: Four times a day (QID) | INTRAMUSCULAR | Status: DC | PRN

## 2019-09-10 MED ORDER — INFLUENZA VAC SPLIT QUAD 0.5 ML IM SUSY
0.5000 mL | PREFILLED_SYRINGE | INTRAMUSCULAR | Status: DC
  Filled 2019-09-10: qty 0.5

## 2019-09-10 MED ORDER — ACETAMINOPHEN 325 MG PO TABS: 650.0000 mg | ORAL_TABLET | Freq: Four times a day (QID) | ORAL | Status: DC | PRN

## 2019-09-10 MED ORDER — DOXYCYCLINE HYCLATE 100 MG PO TABS
100.0000 mg | ORAL_TABLET | Freq: Every day | ORAL | Status: DC
  Administered 2019-09-10: 100 mg via ORAL
  Filled 2019-09-10: qty 1

## 2019-09-10 MED ORDER — FENTANYL CITRATE (PF) 100 MCG/2ML IJ SOLN
INTRAMUSCULAR | Status: AC
  Administered 2019-09-10: 12:00:00
  Filled 2019-09-10: qty 4

## 2019-09-10 MED ORDER — PANTOPRAZOLE SODIUM 40 MG PO TBEC
80.0000 mg | DELAYED_RELEASE_TABLET | Freq: Every day | ORAL | Status: DC
  Administered 2019-09-10 – 2019-09-11 (×2): 80 mg via ORAL
  Filled 2019-09-10 (×2): qty 2

## 2019-09-10 NOTE — ED Notes (Signed)
PT continually heard moaning out, pt continues to refuse any intervention for urinary retention. Will continue to monitor

## 2019-09-10 NOTE — ED Notes (Signed)
Urology attempted to cath pt, pt continues to refuse.

## 2019-09-10 NOTE — ED Provider Notes (Signed)
[facility] Emergency Department Provider Note    First MD Initiated Contact with Patient 09/10/19 [phone]     (approximate)  I have reviewed the triage vital signs and the nursing notes.   HISTORY  Chief Complaint Abdominal Pain   HPI Corey Cortez is a 53 y.o. male with below list of previous medical conditions presents to the emergency department secondary to  1 day history of bilateral flank and suprapubic pain patient states is currently 8 out of 10.  Patient admits to nausea however no vomiting.  Patient denies any fever.  Patient denies any constipation or diarrhea.  Patient denies any known sick contact.        Past Medical History:  Diagnosis Date  . Hypertension   . Seizures (HCC)   . Suprapubic catheter [facility](HCC)     Patient Active Problem List   Diagnosis Date Noted  . ARF (acute renal failure) (HCC) 09/10/2019    Past Surgical History:  Procedure Laterality Date  . BRAIN SURGERY    . BRAIN SURGERY Right     Prior to Admission medications   Medication Sig Start Date End Date Taking? Authorizing Provider  doxycycline (VIBRA-TABS) 100 MG tablet Take 100 mg by mouth daily with supper. 09/03/19   [provider]  meloxicam (MOBIC) 15 MG tablet Take 15 mg by mouth daily as needed (shoulder pain.).  08/07/19   [provider]  pantoprazole (PROTONIX) 40 MG tablet Take 80 mg by mouth daily before breakfast. 06/17/19   [provider]  PROAIR HFA 108 (90 Base) MCG/ACT inhaler Inhale 2 puffs into the lungs every 6 (six) hours as needed for wheezing. 09/08/19   [provider]  SULFACLEANSE 8/4 8-4 % SUSP Apply 1 application topically daily. 09/03/19   [provider]    Allergies Advil [ibuprofen]  History reviewed. No pertinent family history.  Social History Social History   Tobacco Use  . Smoking status: Current Every Day Smoker    Packs/day: 0.50    Types: Cigarettes  . Smokeless  tobacco: Never Used  Substance Use Topics  . Alcohol use: No  . Drug use: Yes    Types: Marijuana    Review of Systems Constitutional: No fever/chills Eyes: No visual changes. ENT: No sore throat. Cardiovascular: Denies chest pain. Respiratory: Denies shortness of breath. Gastrointestinal: No abdominal pain.  No nausea, no vomiting.  No diarrhea.  No constipation.  Positive for bilateral flank and suprapubic pain Genitourinary: Negative for dysuria. Musculoskeletal: Negative for neck pain.  Negative for back pain. Integumentary: Negative for rash. Neurological: Negative for headaches, focal weakness or numbness.   ____________________________________________   PHYSICAL EXAM:  VITAL SIGNS: ED Triage Vitals  Enc Vitals Group     BP 09/10/19 0330 (!) 163/114     Pulse Rate 09/10/19 0330 72     Resp 09/10/19 0330 18     Temp 09/10/19 0330 97.9 F (36.6 C)     Temp Source 09/10/19 0330 Oral     SpO2 09/10/19 0324 98 %     Weight 09/10/19 0332 86.2 kg (190 lb)     Height 09/10/19 0332 1.753 m (5\' 9" )     Head Circumference --      Peak Flow --      Pain Score 09/10/19 0331 9     Pain Loc --      Pain Edu? --      Excl. in GC? --     Constitutional: Alert  and oriented.  Eyes: Conjunctivae are normal. . Mouth/Throat: Patient is wearing a mask. Neck: No stridor.  No meningeal signs.   Cardiovascular: Normal rate, regular rhythm. Good peripheral circulation. Grossly normal heart sounds. Respiratory: Normal respiratory effort.  No retractions. Gastrointestinal: Soft and nontender. No distention.  Musculoskeletal: No lower extremity tenderness nor edema. No gross deformities of extremities. Neurologic:  Normal speech and language. No gross focal neurologic deficits are appreciated.  Skin:  Skin is warm, dry and intact. Psychiatric: Mood and affect are normal. Speech and behavior are normal.  ____________________________________________   LABS (all labs ordered are  listed, but only abnormal results are displayed)  Labs Reviewed  CBC WITH DIFFERENTIAL/PLATELET - Abnormal; Notable for the following components:      Result Value   Platelets 143 (*)    All other components within normal limits  COMPREHENSIVE METABOLIC PANEL - Abnormal; Notable for the following components:   Glucose, Bld 147 (*)    Creatinine, Ser 3.26 (*)    GFR calc non Af Amer 20 (*)    GFR calc Af Amer 24 (*)    All other components within normal limits  LIPASE, BLOOD - Abnormal; Notable for the following components:   Lipase 53 (*)    All other components within normal limits  SARS CORONAVIRUS 2 (TAT 6-24 HRS)  ETHANOL  CK  URINE DRUG SCREEN, QUALITATIVE ([facility] ONLY)  URINALYSIS, COMPLETE (UACMP) WITH MICROSCOPIC   ____________________________________________  EKG  ED ECG REPORT I, [place] N , the attending physician, personally viewed and interpreted this ECG.   Date: 09/10/2019  EKG Time: 3:31 AM  Rate: 72  Rhythm: Normal sinus rhythm  Axis: Normal  Intervals: Normal  ST&T Change: None  ____________________________________________  RADIOLOGY I, [place] N , personally viewed and evaluated these images (plain radiographs) as part of my medical decision making, as well as reviewing the written report by the radiologist.  ED MD interpretation: Small ascites of uncertain source  Official radiology report(s): Ct Renal Stone Study  Result Date: 09/10/2019 CLINICAL DATA:  Flank pain with stone disease suspected. Sudden onset lower abdominal pain last night after eating a [place] sandwich EXAM: CT ABDOMEN AND PELVIS WITHOUT CONTRAST TECHNIQUE: Multidetector CT imaging of the abdomen and pelvis was performed following the standard protocol without IV contrast. COMPARISON:  01/31/2019 FINDINGS: Lower chest: Small right middle lobe nodules are stable from 2016 and considered benign.) Hepatobiliary: No focal liver abnormality. No evidence of erosive.No evidence of  biliary obstruction or stone. Pancreas: Unremarkable. Spleen: Unremarkable. Adrenals/Urinary Tract: Negative adrenals. No hydronephrosis or stone. Moderate distension of the bladder with thick wall except at the dome where there is thinning and lobulation that is chronic. This is likely related to history of suprapubic catheter and possibly of chronic outlet obstruction. Stomach/Bowel:  No obstruction. No appendicitis. Vascular/Lymphatic: No acute vascular abnormality. No mass or adenopathy. Reproductive:Mild symmetric prostate enlargement. Other: Small moderate ascites which is low-density in seen in the pelvis and right gutter/perihepatic space. Trace pelvic ascites with seen on most recent prior and a similar degree of ascites was seen on a 2016 abdominal CT. Musculoskeletal: No acute abnormalities. IMPRESSION: 1. Small ascites from uncertain source; new/increased from recent abdominal CT but similar to a 2016 CT. 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 Please correlate for hematuria. 2. Otherwise negative study.  No urolithiasis. Electronically Signed  By: Monte Fantasia M.D.   On: 09/10/2019 04:36      Procedures   ____________________________________________   INITIAL IMPRESSION / MDM / ASSESSMENT AND PLAN / ED COURSE  As part of my medical decision making, I reviewed the following data within the electronic MEDICAL RECORD NUMBER   53 year old male presenting with above-stated history and physical exam secondary to bilateral flank and suprapubic pain.  Concern for possible renal etiology including ureterolithiasis nephrolithiasis.  Patient's laboratory data noted for creatinine of 3.26 with patient's last creatinine on record of 1.29 from March 2020.  Patient received 2 L IV normal saline in the emergency department and states that  he has no urge to urinate. ____________________________________________  FINAL CLINICAL IMPRESSION(S) / ED DIAGNOSES  Final diagnoses:  Acute renal failure, unspecified acute renal failure type [facility])     MEDICATIONS GIVEN DURING THIS VISIT:  Medications  sodium chloride 0.9 % bolus 1,000 mL (0 mLs Intravenous Stopped 09/10/19 0454)  sodium chloride 0.9 % bolus 1,000 mL (1,000 mLs Intravenous New Bag/Given 09/10/19 0524)  morphine 2 MG/ML injection 2 mg (2 mg Intravenous Given 09/10/19 0444)     ED Discharge Orders    None      *Please note:  Argelio Fleener was evaluated in Emergency Department on 09/10/2019 for the symptoms described in the history of present illness. He was evaluated in the context of the global COVID-19 pandemic, which necessitated consideration that the patient might be at risk for infection with the SARS-CoV-2 virus that causes COVID-19. Institutional protocols and algorithms that pertain to the evaluation of patients at risk for COVID-19 are in a state of rapid change based on information released by regulatory bodies including the CDC and federal and state organizations. These policies and algorithms were followed during the patient's care in the ED.  Some ED evaluations and interventions may be delayed as a result of limited staffing during the pandemic.*  Note:  This document was prepared using Dragon voice recognition software and may include unintentional dictation errors.   Gregor Hams, MD 09/10/19 [phone]

## 2019-09-10 NOTE — ED Notes (Signed)
Pt screaming " help" this RN is at bedside, pt continues to scream while this RN is at bedside. Pt hyperventilating while continuing to scream. Pt trying to use urinal without urine. Pt is 97% on RA, pt states he needs pain meds. MD called to bedside

## 2019-09-10 NOTE — Consult Note (Addendum)
09/10/19 9:34 AM   Ory Fenton Malling 19-Apr-1966 [id number]  CC: Urinary retention  HPI: I saw Mr. Fawaz in the emergency department in consultation from Dr. Cyril Loosen for urinary retention.  He is a 53 year old male with history of drug abuse, and suspected neurogenic bladder previously managed with suprapubic tube who presents with 7 hours of worsening abdominal pain.  Bladder scan in the emergency department 750 mL.  His bladder was previously managed by suprapubic catheter placed by Dr. Apolinar Junes in the operating room in 2016, but it sounds like this was removed sometime in 2018.  The patient is a very poor historian and had already been given Ativan in the ED and was not able to give a complete history.  He reports he has had difficulty urinating over the last few weeks, with acute onset of worsening abdominal pain early this morning.  He denies gross hematuria. He denies any drug use aside from marijuana.   CT stone protocol in the ED this morning showed a moderately distended bladder with diverticulum and moderate ascites.  There was no obvious indication of bladder rupture, however cannot rule out without cystogram.  The ascites was stable from prior imaging.  There is no hydronephrosis or renal abnormality.  From chart review, I am unable to find that he ever underwent urodynamics for formal bladder function analysis.  There are no aggravating or alleviating factors.  Severity is moderate.  PMH: Past Medical History:  Diagnosis Date  . Hypertension   . Seizures (HCC)   . Suprapubic catheter [facility])     Surgical History: Past Surgical History:  Procedure Laterality Date  . BRAIN SURGERY    . BRAIN SURGERY Right     Allergies:  Allergies  Allergen Reactions  . Advil [Ibuprofen] Itching    Family History: Family History  Family history unknown: Yes    Social History:  reports that he has been smoking cigarettes. He has been smoking about 0.50 packs per day. He has never  used smokeless tobacco. He reports current drug use. Drug: Marijuana. He reports that he does not drink alcohol.  ROS: Please see flowsheet from today's date for complete review of systems.  Physical Exam: BP (!) 194/111   Pulse 61   Temp 97.9 F (36.6 C) (Oral)   Resp (!) 30   Ht 5\' 9"  (1.753 m)   Wt 86.2 kg   SpO2 96%   BMI 28.06 kg/m    Constitutional:  Uncomfortable appearing, diaphoretic, combative Cardiovascular: No clubbing, cyanosis, or edema. Respiratory: Normal respiratory effort, no increased work of breathing. GI: tense lower abdomen, abdomen diffusely tender.  Skin puckering at prior SP tube site. Lymph: No cervical or inguinal lymphadenopathy. Skin: No rashes, bruises or suspicious lesions.  Laboratory Data: Reviewed, sCr 3.26 from baseline of 1.3  Pertinent Imaging: I have personally reviewed the CT stone protocol, see HPI  Procedure: Informed consent obtained for foley placement. The patient became acutely combative despite ativan and adamantly refused attempted urethral foley placement.  Assessment & Plan:   The patient is a 53 year old man with history of neurogenic bladder previously managed with suprapubic tube who presents with acute urinary retention and abdominal pain and AKI.  CT shows some ascites that is stable from previous imaging, with no obvious indication of bladder rupture, however cannot completely rule out bladder rupture without a cystogram.  I discussed these findings at length with the patient and strongly recommended Foley placement at the bedside to relieve his pain, and perform cystogram  to rule out bladder injury.  Despite pain medications and IV Valium in conjunction with the ED staff, the patient continued to be combative and adamantly refused urethral Foley placement.  At this point I recommended suprapubic tube for bladder drainage.  With his history of a bladder diverticulum and prior suprapubic tube with scarring, I recommended image  guided suprapubic tube placement with interventional radiology for the safest bladder access.  I discussed his case with Dr. Laurence Ferrari with interventional radiology, who is willing to perform this procedure.  IR suprapubic tube today Cystogram to rule out bladder rupture Monitor for post-obstructive diuresis and allow free access to fluids after SP tube placed Would maintain suprapubic tube as long-term bladder management option in this challenging patient  [place], [place] [address] [place], [place] [address] [phone]

## 2019-09-10 NOTE — ED Notes (Signed)
Pt continually asking this RN to " just put me to sleep". This RN informed pt that we do not put pt to sleep. PT continues calling out " put me to sleep"

## 2019-09-10 NOTE — ED Notes (Signed)
Bladder scan shows >73mL of urine. MD made aware , awaiting orders

## 2019-09-10 NOTE — ED Notes (Signed)
This RN and Jinny Blossom, RN at bedside. While RN's at bedside pt noted to be tachypneic, diaphoretic and sudden onset anxiousness. Pt also noted to have sudden onset hypertension, see documented VS. This RN notified admitting MD regarding patient presentation. Admitting MD placed orders for 1mg  IV Ativan, administered by this RN, states he will come to bedside to see patient. This RN placed patient on cardiac monitor to further monitor patient symptoms. Pt can currently be heard continuously moaning in room. Pt encouraged to keep monitoring equipment on. Will continue ot monitor and keep admitting MD apprised of situation.

## 2019-09-10 NOTE — ED Notes (Signed)
Pt refused to be stuck again for second set of blood cultures. Pt states " I hate being stuck and yall aint sticking me anymore"

## 2019-09-10 NOTE — ED Notes (Signed)
Admitting provider at bedside MD [place] Pink

## 2019-09-10 NOTE — H&P (Addendum)
History and Physical    Corey Cortez LZJ:[id number] DOB: 16-Sep-1966 DOA: 09/10/2019  PCP: Baxter Hire, MD  Patient coming from: Home.  Chief Complaint: Abdominal pain.  HPI: Corey Cortez is a 53 y.o. male with history of seizure status post craniotomy for AVMs, recently placed on meloxicam for right shoulder pain presents to the ER because of right flank pain and difficulty urination.  Patient states he woke up around 2 AM and was trying to urinate when he suddenly developed severe pain from the right flank radiating to his groin.  He also had chills and rigors and brief episode of shortness of breath.  This lasted for around 5 minutes and resolved.  But the pain persisted.  Patient decided to come to the ER.  Patient did not have any nausea vomiting or diarrhea.  No chest pain.  Patient was recently placed on steroids for right shoulder pain following which patient had a skin breakout and was just recently started on Protonix and doxycycline for skin breakout.  Meloxicam was added for the right shoulder pain.  In 2016 patient was admitted for acute renal failure when creatinine was 10 at the time renal failure was attributed to bladder outlet obstruction and patient has had suprapubic catheter placed on 12/25/2016.  Patient during the admission in 2016 also was found to have recurrent ascites unknown etiology and also had spontaneous bacterial peritonitis at that time.  ED Course: In the ER patient had labs drawn which shows creatinine of 3.26 platelets 143 glucose 147 Covid test is pending.  CT abdomen pelvis done shows small ascites of unknown etiology with bladder diverticulum with no signs of rupture.  And EKG shows normal sinus rhythm.  Patient was given 2 L normal saline bolus.  Patient is here to get a urine sample.  Admitted for acute renal failure.  Review of Systems: As per HPI, rest all negative.   Past Medical History:  Diagnosis Date  . Hypertension   . Seizures  ([place])   . Suprapubic catheter [facility])     Past Surgical History:  Procedure Laterality Date  . BRAIN SURGERY    . BRAIN SURGERY Right      reports that he has been smoking cigarettes. He has been smoking about 0.50 packs per day. He has never used smokeless tobacco. He reports current drug use. Drug: Marijuana. He reports that he does not drink alcohol.  Allergies  Allergen Reactions  . Advil [Ibuprofen] Itching    Family History  Family history unknown: Yes    Prior to Admission medications   Medication Sig Start Date End Date Taking? Authorizing Provider  doxycycline (VIBRA-TABS) 100 MG tablet Take 100 mg by mouth daily with supper. 09/03/19  Yes [provider]  meloxicam (MOBIC) 15 MG tablet Take 15 mg by mouth daily as needed (shoulder pain.).  08/07/19  Yes [provider]  pantoprazole (PROTONIX) 40 MG tablet Take 80 mg by mouth daily before breakfast. 06/17/19  Yes [provider]  PROAIR HFA 108 (90 Base) MCG/ACT inhaler Inhale 2 puffs into the lungs every 6 (six) hours as needed for wheezing. 09/08/19  Yes [provider]  SULFACLEANSE 8/4 8-4 % SUSP Apply 1 application topically daily. 09/03/19   [provider]    Physical Exam: Constitutional: Moderately built and nourished. Vitals:   09/10/19 0324 09/10/19 0330 09/10/19 0332 09/10/19 0430  BP:  (!) 150/106  (!) 140/108  Pulse:  72  78  Resp:  (!) 26  (!)  21  Temp:  97.9 F (36.6 C)    TempSrc:  Oral    SpO2: 98% 96%  97%  Weight:   86.2 kg   Height:   5\' 9"  (1.753 m)    Eyes: Anicteric no pallor. ENMT: No discharge from the ears eyes nose or mouth. Neck: No mass felt.  No neck rigidity. Respiratory: No rhonchi or crepitations. Cardiovascular: S1-S2 heard. Abdomen: Soft nontender bowel sound present. Musculoskeletal: No edema. Skin: No rash. Neurologic: Alert awake oriented to time place and person.  Moves all extremities. Psychiatric: Appears normal per normal  affect.   Labs on Admission: I have personally reviewed following labs and imaging studies  CBC: Recent Labs  Lab 09/10/19 0329  WBC 5.5  NEUTROABS 2.9  HGB 15.7  HCT 48.7  MCV 91.2  PLT 143*   Basic Metabolic Panel: Recent Labs  Lab 09/10/19 0329  NA 138  K 4.6  CL 104  CO2 23  GLUCOSE 147*  BUN 18  CREATININE 3.26*  CALCIUM 9.3   GFR: Estimated Creatinine Clearance: 28.2 mL/min (A) (by C-G formula based on SCr of 3.26 mg/dL (H)). Liver Function Tests: Recent Labs  Lab 09/10/19 0329  AST 31  ALT 29  ALKPHOS 79  BILITOT 1.0  PROT 8.1  ALBUMIN 4.4   Recent Labs  Lab 09/10/19 0329  LIPASE 53*   No results for input(s): AMMONIA in the last 168 hours. Coagulation Profile: No results for input(s): INR, PROTIME in the last 168 hours. Cardiac Enzymes: Recent Labs  Lab 09/10/19 0329  CKTOTAL 253   BNP (last 3 results) No results for input(s): PROBNP in the last 8760 hours. HbA1C: No results for input(s): HGBA1C in the last 72 hours. CBG: No results for input(s): GLUCAP in the last 168 hours. Lipid Profile: No results for input(s): CHOL, HDL, LDLCALC, TRIG, CHOLHDL, LDLDIRECT in the last 72 hours. Thyroid Function Tests: No results for input(s): TSH, T4TOTAL, FREET4, T3FREE, THYROIDAB in the last 72 hours. Anemia Panel: No results for input(s): VITAMINB12, FOLATE, FERRITIN, TIBC, IRON, RETICCTPCT in the last 72 hours. Urine analysis:    Component Value Date/Time   COLORURINE ORANGE (A) 01/31/2019 1934   APPEARANCEUR CLEAR 01/31/2019 1934   APPEARANCEUR Hazy 11/22/2014 1100   LABSPEC 1.015 01/31/2019 1934   LABSPEC 1.011 11/22/2014 1100   PHURINE  01/31/2019 1934    TEST NOT REPORTED DUE TO COLOR INTERFERENCE OF URINE PIGMENT   GLUCOSEU (A) 01/31/2019 1934    TEST NOT REPORTED DUE TO COLOR INTERFERENCE OF URINE PIGMENT   GLUCOSEU >=500 11/22/2014 1100   HGBUR (A) 01/31/2019 1934    TEST NOT REPORTED DUE TO COLOR INTERFERENCE OF URINE PIGMENT    BILIRUBINUR (A) 01/31/2019 1934    TEST NOT REPORTED DUE TO COLOR INTERFERENCE OF URINE PIGMENT   BILIRUBINUR Negative 11/22/2014 1100   KETONESUR (A) 01/31/2019 1934    TEST NOT REPORTED DUE TO COLOR INTERFERENCE OF URINE PIGMENT   PROTEINUR (A) 01/31/2019 1934    TEST NOT REPORTED DUE TO COLOR INTERFERENCE OF URINE PIGMENT   NITRITE (A) 01/31/2019 1934    TEST NOT REPORTED DUE TO COLOR INTERFERENCE OF URINE PIGMENT   LEUKOCYTESUR (A) 01/31/2019 1934    TEST NOT REPORTED DUE TO COLOR INTERFERENCE OF URINE PIGMENT   LEUKOCYTESUR Trace 11/22/2014 1100   Sepsis Labs: @LABRCNTIP (procalcitonin:4,lacticidven:4) )No results found for this or any previous visit (from the past 240 hour(s)).   Radiological Exams on Admission: Ct Renal Stone Study  Result  Date: 09/10/2019 CLINICAL DATA:  Flank pain with stone disease suspected. Sudden onset lower abdominal pain last night after eating a [place] sandwich EXAM: CT ABDOMEN AND PELVIS WITHOUT CONTRAST TECHNIQUE: Multidetector CT imaging of the abdomen and pelvis was performed following the standard protocol without IV contrast. COMPARISON:  01/31/2019 FINDINGS: Lower chest: Small right middle lobe nodules are stable from 2016 and considered benign.) Hepatobiliary: No focal liver abnormality. No evidence of erosive.No evidence of biliary obstruction or stone. Pancreas: Unremarkable. Spleen: Unremarkable. Adrenals/Urinary Tract: Negative adrenals. No hydronephrosis or stone. Moderate distension of the bladder with thick wall except at the dome where there is thinning and lobulation that is chronic. This is likely related to history of suprapubic catheter and possibly of chronic outlet obstruction. Stomach/Bowel:  No obstruction. No appendicitis. Vascular/Lymphatic: No acute vascular abnormality. No mass or adenopathy. Reproductive:Mild symmetric prostate enlargement. Other: Small moderate ascites which is low-density in seen in the pelvis and right  gutter/perihepatic space. Trace pelvic ascites with seen on most recent prior and a similar degree of ascites was seen on a 2016 abdominal CT. Musculoskeletal: No acute abnormalities. IMPRESSION: 1. Small ascites from uncertain source; new/increased from recent abdominal CT but similar to a 2016 CT. 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 Please correlate for hematuria. 2. Otherwise negative study.  No urolithiasis. Electronically Signed   By: Marnee Spring M.D.   On: 09/10/2019 04:36    EKG: Independently reviewed.  Normal sinus rhythm.  Assessment/Plan Principal Problem:   ARF (acute renal failure) (HCC) Active Problems:   Ascites    1. Acute renal failure cause not clear.  Patient was just recently started on NSAIDs which could contribute.  Patient also had a previous episode of urinary outlet obstruction in 2016 requiring catheter placement.  For which I have ordered bladder scan to make sure patient is not retaining any urine.  For now we will continue with gentle hydration follow urinalysis FENa and based on which we will have further plans.  Patient is yet to make urine and give sample for urinalysis will check FENa.  I have requested a bladder scan to make sure patient is not retaining any urine.  2. Abdominal pain unknown etiology.  Has ascites and has had previous episodes of recurrent ascites with SBP.  Closely monitor for any further development of worsening ascites fever or chills.  I have ordered blood cultures.  I have not started any antibiotics for now. 3. Thrombocytopenia -appears to be new.  Closely follow CBC.  Patient is afebrile at this time.  If any further worsening of platelets will need hemolytic work-up. 4. History of seizures with craniotomy for AVMs -reviewing patient's notes in care everywhere patient  was on Lamictal even recently up to August 2020 but patient states he not take any medications now for seizures.  Closely observe.  We will need to get further input from primary care. 5. Right shoulder pain being followed by orthopedics.  Hold meloxicam due to renal failure. 6. History of drug abuse urine drug screen is pending.  COVID-19 test, urinalysis, FENa, procalcitonin and chest x-ray are all pending.   DVT prophylaxis: SCDs for now until we make sure there is no further decline in platelets or any further worsening in creatinine. Code Status: Full code. Family Communication: Discussed with patient. Disposition Plan: Home. Consults called: None. Admission status: Observation.  Eduard Clos MD [facility] Pager [phone].  If 7PM-7AM, please contact night-coverage [email or web address] Password [email or web address]  09/10/2019, 6:30 AM

## 2019-09-10 NOTE — Progress Notes (Signed)
PROGRESS NOTE  Corey Cortez Corey Cortez:[id number]:[id number] DOB: [birth year] DOA: 09/10/2019 PCP: Gracelyn NurseJohnston, John D, MD  HPI/Recap of past 24 hours: 53 yo male with hx of seizures & previous episode of urinary retention leading to a suprapubic catheter placement in 2016 (removed in 2018) presented to the ED on the early morning of 11/5 with sudden onset severe right flank pain & inability to urinate.  In the emergency room, patient found to have acute kidney injury with creatinine of 3.26 and CT scan noting small ascites with bladder diverticulum, but no signs of rupture.  Patient given 2 L normal saline bolus and ended up being unable to void.  Bladder scan noted greater than 750 cc of urine.  Patient became quite agitated and any attempts to place a Foley catheter by nursing or even by urology caused him to become quite anxious and aggressive.  Given a dose of Ativan.  Urology contacted interventional radiology for placement of a suprapubic catheter.  Patient seen prior to catheter placement.  He is having some mild discomfort with the Ativan, he does feel better.  Complains of occasional spasms.  Assessment/Plan: Principal Problem:   ARF (acute renal failure) (HCC): Secondary to urinary retention.  Should improve following catheter placement.  Will hydrate.  Checking urinalysis once he voids as well as fractional excretion of sodium.  Active Problems:   Ascites: Unclear etiology.  Monitoring for now 7   Retention of urine: Unclear etiology, possibly neurogenic bladder.  Placement suprapubic catheter. Urology and interventional radiology help.  Urology plans to take patient for cystogram to rule out bladder rupture.  He also recommends long-term suprapubic tube    Hypertension: Blood pressure improved with Ativan.    Overweight: Meets criteria BMI greater than 25.    Substance abuse (HCC): Smokes cigarettes as well as marijuana.  Counseled on both.   Code Status: Full code  Family Communication:  Declined for me to call any family  Disposition Plan: Once renal function normalized, cleared by urology   Consultants:  Urology  Interventional radiology  Procedures:  Placement of suprapubic catheter 11/5  Antimicrobials:  Preop Ancef  Patient on daily doxycycline for skin reaction  DVT prophylaxis: SCDs   Objective: Vitals:   09/10/19 1250 09/10/19 1307  BP: (!) 124/91   Pulse: 88 89  Resp: 20 16  Temp:    SpO2: 98% 92%   No intake or output data in the 24 hours ending 09/10/19 1342 Filed Weights   09/10/19 0332  Weight: 86.2 kg   Body mass index is 28.06 kg/m.  Exam:   General: Alert and oriented x3, mild distress secondary to spasms and pain  HEENT: Normocephalic and atraumatic, mucous membranes are slightly dry  Neck is thick, narrow airway  Cardiovascular: Regular rate and rhythm, S1-S2  Respiratory: Clear to auscultation bilaterally  Abdomen: Soft, nontender, nondistended, positive bowel sounds  Musculoskeletal: No clubbing or cyanosis or edema  Neuro: No focal deficits  Skin: No skin breaks, tears or lesions  Psychiatry: Slightly agitated, but otherwise no evidence of acute psychoses   Data Reviewed: CBC: Recent Labs  Lab 09/10/19 0329  WBC 5.5  NEUTROABS 2.9  HGB 15.7  HCT 48.7  MCV 91.2  PLT 143*   Basic Metabolic Panel: Recent Labs  Lab 09/10/19 0329  NA 138  K 4.6  CL 104  CO2 23  GLUCOSE 147*  BUN 18  CREATININE 3.26*  CALCIUM 9.3   GFR: Estimated Creatinine Clearance: 28.2 mL/min (A) (by C-G formula  based on SCr of 3.26 mg/dL (H)). Liver Function Tests: Recent Labs  Lab 09/10/19 0329  AST 31  ALT 29  ALKPHOS 79  BILITOT 1.0  PROT 8.1  ALBUMIN 4.4   Recent Labs  Lab 09/10/19 0329  LIPASE 53*   No results for input(s): AMMONIA in the last 168 hours. Coagulation Profile: Recent Labs  Lab 09/10/19 1015  INR 1.0   Cardiac Enzymes: Recent Labs  Lab 09/10/19 0329  CKTOTAL 253   BNP (last  3 results) No results for input(s): PROBNP in the last 8760 hours. HbA1C: No results for input(s): HGBA1C in the last 72 hours. CBG: No results for input(s): GLUCAP in the last 168 hours. Lipid Profile: No results for input(s): CHOL, HDL, LDLCALC, TRIG, CHOLHDL, LDLDIRECT in the last 72 hours. Thyroid Function Tests: No results for input(s): TSH, T4TOTAL, FREET4, T3FREE, THYROIDAB in the last 72 hours. Anemia Panel: No results for input(s): VITAMINB12, FOLATE, FERRITIN, TIBC, IRON, RETICCTPCT in the last 72 hours. Urine analysis:    Component Value Date/Time   COLORURINE ORANGE (A) 01/31/2019 1934   APPEARANCEUR CLEAR 01/31/2019 1934   APPEARANCEUR Hazy 11/22/2014 1100   LABSPEC 1.015 01/31/2019 1934   LABSPEC 1.011 11/22/2014 1100   PHURINE  01/31/2019 1934    TEST NOT REPORTED DUE TO COLOR INTERFERENCE OF URINE PIGMENT   GLUCOSEU (A) 01/31/2019 1934    TEST NOT REPORTED DUE TO COLOR INTERFERENCE OF URINE PIGMENT   GLUCOSEU >=500 11/22/2014 1100   HGBUR (A) 01/31/2019 1934    TEST NOT REPORTED DUE TO COLOR INTERFERENCE OF URINE PIGMENT   BILIRUBINUR (A) 01/31/2019 1934    TEST NOT REPORTED DUE TO COLOR INTERFERENCE OF URINE PIGMENT   BILIRUBINUR Negative 11/22/2014 1100   KETONESUR (A) 01/31/2019 1934    TEST NOT REPORTED DUE TO COLOR INTERFERENCE OF URINE PIGMENT   PROTEINUR (A) 01/31/2019 1934    TEST NOT REPORTED DUE TO COLOR INTERFERENCE OF URINE PIGMENT   NITRITE (A) 01/31/2019 1934    TEST NOT REPORTED DUE TO COLOR INTERFERENCE OF URINE PIGMENT   LEUKOCYTESUR (A) 01/31/2019 1934    TEST NOT REPORTED DUE TO COLOR INTERFERENCE OF URINE PIGMENT   LEUKOCYTESUR Trace 11/22/2014 1100   Sepsis Labs: @LABRCNTIP (procalcitonin:4,lacticidven:4)  ) Recent Results (from the past 240 hour(s))  SARS CORONAVIRUS 2 (TAT 6-24 HRS) Nasopharyngeal Nasopharyngeal Swab     Status: None   Collection Time: 09/10/19  5:33 AM   Specimen: Nasopharyngeal Swab  Result Value Ref Range Status    SARS Coronavirus 2 NEGATIVE NEGATIVE Final    Comment: (NOTE) SARS-CoV-2 target nucleic acids are NOT DETECTED. The SARS-CoV-2 RNA is generally detectable in upper and lower respiratory specimens during the acute phase of infection. Negative results do not preclude SARS-CoV-2 infection, do not rule out co-infections with other pathogens, and should not be used as the sole basis for treatment or other patient management decisions. Negative results must be combined with clinical observations, patient history, and epidemiological information. The expected result is Negative. Fact Sheet for Patients: 13/05/20 Fact Sheet for Healthcare Providers: HairSlick.no This test is not yet approved or cleared by the quierodirigir.com FDA and  has been authorized for detection and/or diagnosis of SARS-CoV-2 by FDA under an Emergency Use Authorization (EUA). This EUA will remain  in effect (meaning this test can be used) for the duration of the COVID-19 declaration under Section 56 4(b)(1) of the Act, 21 U.S.C. section 360bbb-3(b)(1), unless the authorization is terminated or revoked sooner. Performed  at [facility], [place] [address], [place], [place] [address]       Studies: Dg Chest Port 1 View  Result Date: 09/10/2019 CLINICAL DATA:  Short of breath EXAM: PORTABLE CHEST 1 VIEW COMPARISON:  02/04/2016 FINDINGS: The heart size and mediastinal contours are within normal limits. Both lungs are clear. The visualized skeletal structures are unremarkable. IMPRESSION: No active disease. Electronically Signed   By: Franchot Gallo M.D.   On: 09/10/2019 07:13   Ct Renal Stone Study  Result Date: 09/10/2019 CLINICAL DATA:  Flank pain with stone disease suspected. Sudden onset lower abdominal pain last night after eating a [place] sandwich EXAM: CT ABDOMEN AND PELVIS WITHOUT CONTRAST TECHNIQUE: Multidetector CT imaging of the abdomen and  pelvis was performed following the standard protocol without IV contrast. COMPARISON:  01/31/2019 FINDINGS: Lower chest: Small right middle lobe nodules are stable from 2016 and considered benign.) Hepatobiliary: No focal liver abnormality. No evidence of erosive.No evidence of biliary obstruction or stone. Pancreas: Unremarkable. Spleen: Unremarkable. Adrenals/Urinary Tract: Negative adrenals. No hydronephrosis or stone. Moderate distension of the bladder with thick wall except at the dome where there is thinning and lobulation that is chronic. This is likely related to history of suprapubic catheter and possibly of chronic outlet obstruction. Stomach/Bowel:  No obstruction. No appendicitis. Vascular/Lymphatic: No acute vascular abnormality. No mass or adenopathy. Reproductive:Mild symmetric prostate enlargement. Other: Small moderate ascites which is low-density in seen in the pelvis and right gutter/perihepatic space. Trace pelvic ascites with seen on most recent prior and a similar degree of ascites was seen on a 2016 abdominal CT. Musculoskeletal: No acute abnormalities. IMPRESSION: 1. Small ascites from uncertain source; new/increased from recent abdominal CT but similar to a 2016 CT. 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 Please correlate for hematuria. 2. Otherwise negative study.  No urolithiasis. Electronically Signed   By: Monte Fantasia M.D.   On: 09/10/2019 04:36    Scheduled Meds: . doxycycline  100 mg Oral Q supper  . fentaNYL      . pantoprazole  80 mg Oral QAC breakfast    Continuous Infusions: . lactated ringers Stopped (09/10/19 0910)     LOS: 0 days     Annita Brod, MD [facility]  To reach me or the doctor on call, go to: [email or web address] Password [email or web address]  09/10/2019, 1:42 PM

## 2019-09-10 NOTE — ED Notes (Signed)
Patient transported to CT 

## 2019-09-10 NOTE — ED Triage Notes (Signed)
Patient from home by [facility]. Patient states he feels like he has to urinate. Patient states having RLQ pain 9/10. Per EMS patient stated to them he has shoulder issues and provider started him on steroids that caused rash on body (no rash noted) and was given GERD medications on 09/08/2019. Patient has anxiety.

## 2019-09-10 NOTE — ED Notes (Addendum)
MD Corky Downs and Urologist at bedside . PT continues to refuse catheter " unless you put me to sleep " MD Kinner offered medication to assist with comfort of procedure

## 2019-09-10 NOTE — ED Notes (Signed)
Patient refused to have COVID swab preformed and states to this writer, "That damn swab ain't going up my nose."

## 2019-09-10 NOTE — ED Notes (Signed)
PT back to room at this time, pt is alert but drowsy. VSS

## 2019-09-10 NOTE — ED Notes (Signed)
Patient was at first refusing COVID swab. Patient started screaming at this Probation officer. EDP Dr. Owens Shark spoke with patient about swab. After lengthy conversation patient agreed to be swabbed. Patient tolerated well with no issues.

## 2019-09-10 NOTE — ED Notes (Signed)
This RN to bedside, pt given personal cell phone at this time to make phone calls.

## 2019-09-10 NOTE — Procedures (Signed)
Interventional Radiology Procedure Note  Procedure: Placement of a 75F suprapubic catheter.   Complications: None  Estimated Blood Loss: None  Recommendations: - Tube to gravity - Return to IR in 4-6 weeks for tube exchange and upsize.  Signed,  Criselda Peaches, MD

## 2019-09-11 DIAGNOSIS — N319 Neuromuscular dysfunction of bladder, unspecified: Secondary | ICD-10-CM | POA: Diagnosis not present

## 2019-09-11 DIAGNOSIS — R339 Retention of urine, unspecified: Secondary | ICD-10-CM | POA: Diagnosis not present

## 2019-09-11 DIAGNOSIS — E663 Overweight: Secondary | ICD-10-CM

## 2019-09-11 DIAGNOSIS — F1910 Other psychoactive substance abuse, uncomplicated: Secondary | ICD-10-CM

## 2019-09-11 DIAGNOSIS — N179 Acute kidney failure, unspecified: Secondary | ICD-10-CM | POA: Diagnosis not present

## 2019-09-11 LAB — CBC
HCT: 37.9 % — ABNORMAL LOW (ref 39.0–52.0)
Hemoglobin: 12.8 g/dL — ABNORMAL LOW (ref 13.0–17.0)
MCH: 29.2 pg (ref 26.0–34.0)
MCHC: 33.8 g/dL (ref 30.0–36.0)
MCV: 86.5 fL (ref 80.0–100.0)
Platelets: 136 10*3/uL — ABNORMAL LOW (ref 150–400)
RBC: 4.38 MIL/uL (ref 4.22–5.81)
RDW: 13.2 % (ref 11.5–15.5)
WBC: 5.8 10*3/uL (ref 4.0–10.5)
nRBC: 0 % (ref 0.0–0.2)

## 2019-09-11 LAB — BASIC METABOLIC PANEL
Anion gap: 6 (ref 5–15)
BUN: 13 mg/dL (ref 6–20)
CO2: 24 mmol/L (ref 22–32)
Calcium: 8.7 mg/dL — ABNORMAL LOW (ref 8.9–10.3)
Chloride: 107 mmol/L (ref 98–111)
Creatinine, Ser: 1.2 mg/dL (ref 0.61–1.24)
GFR calc Af Amer: >60 mL/min (ref >60)
GFR calc non Af Amer: >60 mL/min (ref >60)
Glucose, Bld: 97 mg/dL (ref 70–99)
Potassium: 4.4 mmol/L (ref 3.5–5.1)
Sodium: 137 mmol/L (ref 135–145)

## 2019-09-11 NOTE — TOC Transition Note (Signed)
Transition of Care [facility]) - CM/SW Discharge Note   Patient Details  Name: Corey Cortez MRN: [id number] Date of Birth: 1965-11-29  [facility] CM/SW Contact:  Annamaria Boots, [place] Phone Number: 09/11/2019, 4:16 PM   Clinical Narrative:   Patient will discharge home today with home health. CSW spoke with patient regarding discharge plan. Patient reports that he lives alone but has family support. Patient needs home health RN. Patient chose [facility]. CSW notified Corene Cornea at [facility] of referral. Patient has transportation and has no DME or medication needs at this time.     Final next level of care: [place] Barriers to Discharge: No Barriers Identified   Patient Goals and CMS Choice Patient states their goals for this hospitalization and ongoing recovery are:: return home CMS Medicare.gov Compare Post Acute Care list provided to:: Patient Choice offered to / list presented to : Patient  Discharge Placement                       Discharge Plan and Services     Post Acute Care Choice: Home Health                    HH Arranged: RN [facility] Agency: [place] ([place]) Date [place]: 09/11/19   Representative spoke with at [place]: [place] ([place]) Interventions     Readmission Risk Interventions No flowsheet data found.

## 2019-09-11 NOTE — Progress Notes (Addendum)
   Subjective Pain significantly improved after suprapubic tube placement Labs pending this morning Drug screen positive for cocaine, opiates, benzodiazepines, and marijuana  Physical Exam: BP 117/77 (BP Location: Left Arm)   Pulse 72   Temp 98.1 F (36.7 C) (Oral)   Resp 20   Ht 5\' 9"  (1.753 m)   Wt 86.2 kg   SpO2 97%   BMI 28.06 kg/m    Constitutional:  Alert and oriented, No acute distress. Respiratory: Normal respiratory effort, no increased work of breathing. GI: Abdomen is soft, minimally tender, non-distended, non-peritonitic GU: Suprapubic tube draining clear yellow urine  Laboratory Data: Pending  Assessment & Plan:   In summary, the patient is a 53 year old male with history of urinary retention previously managed by suprapubic tube.  It sounds like this was removed a few years ago for unclear reasons.  He was previously followed by both [facility] and [facility].  I recommended a cystogram today to rule out bladder injury, but the patient refused and wants to leave the hospital.  We discussed the risks at length including peritonitis, infection, and death.  Labs are still pending this morning.  I had a very frank conversation with the patient this morning that we cannot follow him in our clinic.  This patient has been fired by [employer] secondary to recurrent violent behavior and verbal abuse towards staff on numerous occasions in the past.  His urinary retention is most likely drug-induced with his pan-positive drug screen.  He will need to follow-up with [facility] for likely long-term suprapubic pubic exchanges.  Could also consider urodynamics in the future and possible outlet procedure if his bladder is functional.   Billey Co, MD

## 2019-09-11 NOTE — Progress Notes (Signed)
Discharge instructions reviewed with patient including followup visits.  Understanding was verbalized and all questions were answered.  Extra leg bag provided.  IV removed without complication; patient tolerated well.  Patient discharged home ambulatory in stable condition escorted by nursing staff.

## 2019-09-11 NOTE — Care Management Obs Status (Signed)
[place] NOTIFICATION   Patient Details  Name: Corey Cortez MRN: [id number] Date of Birth: October 31, 1966   Medicare Observation Status Notification Given:  Yes    Annamaria Boots, [place] 09/11/2019, 4:02 PM

## 2019-09-11 NOTE — Discharge Summary (Signed)
Discharge Summary  Corey Cortez RUE:[id number] DOB: 1965-12-03  PCP: Gracelyn Nurse, MD  Admit date: 09/10/2019 Discharge date: 09/11/2019  Time spent: 25 minutes  Recommendations for Outpatient Follow-up:  1. Patient will follow-up with interventional radiology in 4 to 6 weeks for tube exchange and upsize  Discharge Diagnoses:  Active Hospital Problems   Diagnosis Date Noted  . ARF (acute renal failure) (HCC) 09/10/2019  . Ascites 09/10/2019  . Neurogenic bladder   . Hypertension   . Overweight   . Retention of urine   . Substance abuse [facility])     Resolved Hospital Problems  No resolved problems to display.    Discharge Condition: Improved, being discharged home  Diet recommendation: Heart healthy diet  Vitals:   09/10/19 2047 09/11/19 0524  BP: 118/79 117/77  Pulse: 77 72  Resp: 20 20  Temp: 98.2 F (36.8 C) 98.1 F (36.7 C)  SpO2: 96% 97%    History of present illness:  53 yo male with hx of polysubstance abuse & previous episode of urinary retention leading to a suprapubic catheter placement in 2016 (removed in 2018) presented to the ED on the early morning of 11/5 with sudden onset severe right flank pain & inability to urinate.  In the emergency room, patient found to have acute kidney injury with creatinine of 3.26 and CT scan noting small ascites with bladder diverticulum, but no signs of rupture.  Patient given 2 L normal saline bolus and ended up being unable to void.  Bladder scan noted greater than 750 cc of urine.  Patient became quite agitated and any attempts to place a Foley catheter by nursing or even by urology caused him to become quite anxious and aggressive.  Given a dose of Ativan.  Urology contacted interventional radiology for placement of a suprapubic catheter.   Hospital Course:  Principal Problem:   ARF (acute renal failure) (HCC): Following catheter placement and passage of urine along with IV fluids, renal function normalized on  11/6.  Felt to be stable.  No evidence of infection with normal white count, normal lactic acid level and procalcitonin, no fever.  No need for antibiotics Active Problems:   Ascites   Retention of urine   Neurogenic bladder   Hypertension   Overweight: Meets criteria BMI greater than 25   Substance abuse (HCC) causing neurogenic bladder and secondary urinary retention: Patient counseled by myself as well as urology that he needs to discontinue his drug use and this would be the cause.  He was advised likely this catheter will be long-term and may end up being permanent.  He says he will quit.  Follow-up with interventional radiology in 4 to 6 weeks for catheter exchange and upsizing   Procedures:  Placement of suprapubic catheter done 11/5  Consultations:  Urology  interventional radiology  Discharge Exam: BP 117/77 (BP Location: Left Arm)   Pulse 72   Temp 98.1 F (36.7 C) (Oral)   Resp 20   Ht 5\' 9"  (1.753 m)   Wt 86.2 kg   SpO2 97%   BMI 28.06 kg/m   General: Alert and oriented x3, no acute distress Cardiovascular: Regular rate and rhythm, S1-S2 Respiratory: Clear to auscultation bilaterally  Discharge Instructions You were cared for by a hospitalist during your hospital stay. If you have any questions about your discharge medications or the care you received while you were in the hospital after you are discharged, you can call the unit and asked to speak with the  hospitalist on call if the hospitalist that took care of you is not available. Once you are discharged, your primary care physician will handle any further medical issues. Please note that NO REFILLS for any discharge medications will be authorized once you are discharged, as it is imperative that you return to your primary care physician (or establish a relationship with a primary care physician if you do not have one) for your aftercare needs so that they can reassess your need for medications and monitor your lab  values.  Discharge Instructions    Diet - low sodium heart healthy   Complete by: As directed    Increase activity slowly   Complete by: As directed      Allergies as of 09/11/2019      Reactions   Advil [ibuprofen] Itching      Medication List    TAKE these medications   doxycycline 100 MG tablet Commonly known as: VIBRA-TABS Take 100 mg by mouth daily with supper.   meloxicam 15 MG tablet Commonly known as: MOBIC Take 15 mg by mouth daily as needed (shoulder pain.).   pantoprazole 40 MG tablet Commonly known as: PROTONIX Take 80 mg by mouth daily before breakfast.   ProAir HFA 108 (90 Base) MCG/ACT inhaler Generic drug: albuterol Inhale 2 puffs into the lungs every 6 (six) hours as needed for wheezing.   SulfaCleanse 8/4 8-4 % Susp Generic drug: Sulfacetamide Sodium-Sulfur Apply 1 application topically daily.      Allergies  Allergen Reactions  . Advil [Ibuprofen] Itching      The results of significant diagnostics from this hospitalization (including imaging, microbiology, ancillary and laboratory) are listed below for reference.    Significant Diagnostic Studies: Dg Chest Port 1 View  Result Date: 09/10/2019 CLINICAL DATA:  Short of breath EXAM: PORTABLE CHEST 1 VIEW COMPARISON:  02/04/2016 FINDINGS: The heart size and mediastinal contours are within normal limits. Both lungs are clear. The visualized skeletal structures are unremarkable. IMPRESSION: No active disease. Electronically Signed   By: Marlan Palau M.D.   On: 09/10/2019 07:13   Ct Renal Stone Study  Result Date: 09/10/2019 CLINICAL DATA:  Flank pain with stone disease suspected. Sudden onset lower abdominal pain last night after eating a [place] sandwich EXAM: CT ABDOMEN AND PELVIS WITHOUT CONTRAST TECHNIQUE: Multidetector CT imaging of the abdomen and pelvis was performed following the standard protocol without IV contrast. COMPARISON:  01/31/2019 FINDINGS: Lower chest: Small right middle lobe  nodules are stable from 2016 and considered benign.) Hepatobiliary: No focal liver abnormality. No evidence of erosive.No evidence of biliary obstruction or stone. Pancreas: Unremarkable. Spleen: Unremarkable. Adrenals/Urinary Tract: Negative adrenals. No hydronephrosis or stone. Moderate distension of the bladder with thick wall except at the dome where there is thinning and lobulation that is chronic. This is likely related to history of suprapubic catheter and possibly of chronic outlet obstruction. Stomach/Bowel:  No obstruction. No appendicitis. Vascular/Lymphatic: No acute vascular abnormality. No mass or adenopathy. Reproductive:Mild symmetric prostate enlargement. Other: Small moderate ascites which is low-density in seen in the pelvis and right gutter/perihepatic space. Trace pelvic ascites with seen on most recent prior and a similar degree of ascites was seen on a 2016 abdominal CT. Musculoskeletal: No acute abnormalities. IMPRESSION: 1. Small ascites from uncertain source; new/increased from recent abdominal CT but similar to a 2016 CT. There is an irregularly-shaped bladder diverticulum (likely sequela of prior suprapubic catheter) but there is no adjacent fat stranding or bladder collapse which would be expected  for a diverticular rupture. Please correlate for hematuria. 2. Otherwise negative study.  No urolithiasis. Electronically Signed   By: Monte Fantasia M.D.   On: 09/10/2019 04:36   Ct Image Guided Fluid Drain By Catheter  Result Date: 09/10/2019 INDICATION: 53 year old male with recurrent urinary retention, possible neurogenic bladder. He failed Foley catheter placement and presents for suprapubic catheter placement. EXAM: CT IMAGE GUIDED FLUID DRAIN BY CATHETER COMPARISON:  None. MEDICATIONS: 2 g Ancef; The antibiotic was administered in an appropriate time frame prior to skin puncture. ANESTHESIA/SEDATION: Fentanyl 100 mcg IV; Versed 2 mg IV Moderate Sedation Time:  15 minutes The patient  was continuously monitored during the procedure by the interventional radiology nurse under my direct supervision. CONTRAST:  None FLUOROSCOPY TIME:  None COMPLICATIONS: None immediate. PROCEDURE: Informed written consent was obtained from the patient after a thorough discussion of the procedural risks, benefits and alternatives. All questions were addressed. A timeout was performed prior to the initiation of the procedure. A planning axial CT scan was performed. The bladder was successfully identified. A suitable skin entry site was selected and marked. The overlying skin was sterilely prepped and draped in the standard fashion using chlorhexidine skin prep. Local anesthesia was attained by infiltration with 1% lidocaine. Under intermittent CT guidance, an 18 gauge trocar needle was advanced through the anterior abdominal wall and into the bladder. An Amplatz wire was then coiled in the bladder. The trocar needle was removed. The skin tract was serially dilated to 29 [place] and then a Cook 14 French all-purpose drainage catheter was advanced over the wire and formed in the bladder. The catheter was connected to a gravity bag with return of clear yellow urine. The catheter was then secured to the skin with 0 Prolene suture. Overall, the patient tolerated the procedure well. IMPRESSION: Successful placement of a 11 French percutaneous suprapubic catheter. PLAN: 1. Maintain drain to urinary bag. 2. Provide patient and caregiver Education. 3. Return to interventional Radiology in 6 weeks for tube exchange and up size. Signed, Criselda Peaches, MD, [facility] Radiology Electronically Signed   By: Jacqulynn Cadet M.D.   On: 09/10/2019 15:14    Microbiology: Recent Results (from the past 240 hour(s))  SARS CORONAVIRUS 2 (TAT 6-24 HRS) Nasopharyngeal Nasopharyngeal Swab     Status: None   Collection Time: 09/10/19  5:33 AM   Specimen: Nasopharyngeal Swab   Result Value Ref Range Status   SARS Coronavirus 2 NEGATIVE NEGATIVE Final    Comment: (NOTE) SARS-CoV-2 target nucleic acids are NOT DETECTED. The SARS-CoV-2 RNA is generally detectable in upper and lower respiratory specimens during the acute phase of infection. Negative results do not preclude SARS-CoV-2 infection, do not rule out co-infections with other pathogens, and should not be used as the sole basis for treatment or other patient management decisions. Negative results must be combined with clinical observations, patient history, and epidemiological information. The expected result is Negative. Fact Sheet for Patients: SugarRoll.be Fact Sheet for Healthcare Providers: https://www.woods-mathews.com/ This test is not yet approved or cleared by the [place] FDA and  has been authorized for detection and/or diagnosis of SARS-CoV-2 by FDA under an Emergency Use Authorization (EUA). This EUA will remain  in effect (meaning this test can be used) for the duration of the COVID-19 declaration under Section 56 4(b)(1) of the Act, 21 U.S.C. section 360bbb-3(b)(1), unless the authorization is terminated or revoked sooner. Performed at [facility], [place] [address],  [place], [place] [address]      Labs: Basic Metabolic Panel: Recent Labs  Lab 09/10/19 0329 09/11/19 0902  NA 138 137  K 4.6 4.4  CL 104 107  CO2 23 24  GLUCOSE 147* 97  BUN 18 13  CREATININE 3.26* 1.20  CALCIUM 9.3 8.7*   Liver Function Tests: Recent Labs  Lab 09/10/19 0329  AST 31  ALT 29  ALKPHOS 79  BILITOT 1.0  PROT 8.1  ALBUMIN 4.4   Recent Labs  Lab 09/10/19 0329  LIPASE 53*   No results for input(s): AMMONIA in the last 168 hours. CBC: Recent Labs  Lab 09/10/19 0329 09/11/19 0902  WBC 5.5 5.8  NEUTROABS 2.9  --   HGB 15.7 12.8*  HCT 48.7 37.9*  MCV 91.2 86.5  PLT 143* 136*   Cardiac Enzymes: Recent Labs  Lab 09/10/19 0329   CKTOTAL 253   BNP: BNP (last 3 results) No results for input(s): BNP in the last 8760 hours.  ProBNP (last 3 results) No results for input(s): PROBNP in the last 8760 hours.  CBG: No results for input(s): GLUCAP in the last 168 hours.     Signed:  Hollice EspySendil K Zaniya Mcaulay, MD [facility] 09/11/2019, 2:52 PM

## 2019-09-14 ENCOUNTER — Other Ambulatory Visit (HOSPITAL_COMMUNITY): Payer: Self-pay | Admitting: Interventional Radiology

## 2019-09-14 DIAGNOSIS — Z9359 Other cystostomy status: Secondary | ICD-10-CM

## 2019-09-15 LAB — CULTURE, BLOOD (ROUTINE X 2)
Culture: NO GROWTH
Culture: NO GROWTH
Special Requests: ADEQUATE
Special Requests: ADEQUATE

## 2019-09-16 ENCOUNTER — Other Ambulatory Visit: Admission: RE | Admit: 2019-09-16 | Payer: Medicare Other | Source: Ambulatory Visit

## 2019-09-18 ENCOUNTER — Other Ambulatory Visit: Payer: Medicare Other

## 2019-09-22 ENCOUNTER — Ambulatory Visit: Admission: RE | Admit: 2019-09-22 | Payer: Medicare Other | Source: Home / Self Care | Admitting: Surgery

## 2019-09-22 ENCOUNTER — Encounter: Admission: RE | Payer: Self-pay | Source: Home / Self Care

## 2019-09-22 SURGERY — SHOULDER ARTHROSCOPY WITH ROTATOR CUFF REPAIR AND OPEN BICEPS TENODESIS
Anesthesia: Choice | Site: Shoulder | Laterality: Right

## 2019-09-23 ENCOUNTER — Telehealth: Payer: Self-pay | Admitting: Urology

## 2019-09-23 NOTE — Telephone Encounter (Signed)
Pt called the office this am demanding an appt, I told him that he was no longer a pt here (per Dr Doristine Counter note) and he would like a call back to discuss.

## 2019-10-08 ENCOUNTER — Other Ambulatory Visit: Payer: Self-pay | Admitting: Radiology

## 2019-10-09 ENCOUNTER — Ambulatory Visit (HOSPITAL_COMMUNITY): Admit: 2019-10-09 | Payer: Medicare Other

## 2019-10-23 ENCOUNTER — Ambulatory Visit: Admission: RE | Admit: 2019-10-23 | Payer: Medicare Other | Source: Ambulatory Visit

## 2020-05-19 ENCOUNTER — Other Ambulatory Visit: Payer: Self-pay | Admitting: Surgery

## 2020-05-23 ENCOUNTER — Encounter
Admission: RE | Admit: 2020-05-23 | Discharge: 2020-05-23 | Disposition: A | Discharge status: Home / Self Care | Payer: Medicare Other | Source: Ambulatory Visit | Attending: Surgery | Admitting: Surgery

## 2020-05-23 ENCOUNTER — Other Ambulatory Visit: Payer: Self-pay

## 2020-05-23 DIAGNOSIS — J449 Chronic obstructive pulmonary disease, unspecified: Secondary | ICD-10-CM | POA: Insufficient documentation

## 2020-05-23 DIAGNOSIS — Z01818 Encounter for other preprocedural examination: Secondary | ICD-10-CM | POA: Insufficient documentation

## 2020-05-23 HISTORY — DX: Foot drop, right foot: M21.371

## 2020-05-23 HISTORY — DX: Dyspnea, unspecified: R06.00

## 2020-05-23 HISTORY — DX: Arteriovenous malformation, site unspecified: Q27.30

## 2020-05-23 HISTORY — DX: Other ascites: R18.8

## 2020-05-23 HISTORY — DX: Unspecified osteoarthritis, unspecified site: M19.90

## 2020-05-23 HISTORY — DX: Tremor, unspecified: R25.1

## 2020-05-23 HISTORY — DX: Gastro-esophageal reflux disease without esophagitis: K21.9

## 2020-05-23 HISTORY — DX: Chronic obstructive pulmonary disease, unspecified: J44.9
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
## 2020-05-23 NOTE — Patient Instructions (Signed)
Your procedure is scheduled on: 05-26-20 THURSDAY Report to Same Day Surgery 2nd floor medical mall [facility] Entrance-take elevator on left to 2nd floor.  Check in with surgery information desk.) To find out your arrival time please call [phone] between 1PM - 3PM on 05-25-20 [facility]  Remember: Instructions that are not followed completely may result in serious medical risk, up to and including death, or upon the discretion of your surgeon and anesthesiologist your surgery may need to be rescheduled.    _x___ 1. Do not eat food after midnight the night before your procedure. NO GUM OR CANDY AFTER MIDNIGHT. You may drink clear liquids up to 2 hours before you are scheduled to arrive at the hospital for your procedure.  Do not drink clear liquids within 2 hours of your scheduled arrival to the hospital.  Clear liquids include  --Water or Apple juice without pulp  --Gatorade  --Black Coffee or Clear Tea (No milk, no creamers, do not add anything to the coffee or Tea-OK TO ADD SUGAR)  _X___Ensure clear carbohydrate drink-FINISH DRINK 2 HOURS PRIOR TO ARRIVAL TIME TO HOSPITAL THE DAY OF YOUR SURGERY     __x__ 2. No Alcohol for 24 hours before or after surgery.   __x__3. No Smoking or e-cigarettes for 24 prior to surgery.  Do not use any chewable tobacco products for at least 6 hour prior to surgery   ____  4. Bring all medications with you on the day of surgery if instructed.    __x__ 5. Notify your doctor if there is any change in your medical condition     (cold, fever, infections).    x___6. On the morning of surgery brush your teeth with toothpaste and water.  You may rinse your mouth with mouth wash if you wish.  Do not swallow any toothpaste or mouthwash.   Do not wear jewelry, make-up, hairpins, clips or nail polish.  Do not wear lotions, powders, or perfumes.  Do not shave 48 hours prior to surgery. Men may shave face and neck.  Do not bring valuables to the hospital.     [facility] is not responsible for any belongings or valuables.               Contacts, dentures or bridgework may not be worn into surgery.  Leave your suitcase in the car. After surgery it may be brought to your room.  For patients admitted to the hospital, discharge time is determined by your treatment team.  _  Patients discharged the day of surgery will not be allowed to drive home.  You will need someone to drive you home and stay with you the night of your procedure.    Please read over the following fact sheets that you were given:   [facility] Preparing for Surgery/INCENTIVE SPIROMETER INSTRUCTIONS-THE ACTUAL INCENTIVE SPIROMETER DEVICE WILL BE  GIVEN TO YOU THE DAY OF YOUR SURGERY  _x___ TAKE THE FOLLOWING MEDICATION THE MORNING OF SURGERY WITH A SMALL SIP OF WATER. These include:  1. FLOMAX (TAMSULOSIN)  2. PROTONIX (PANTOPRZOLE)  3. TAKE AN EXTRA PROTONIX THE NIGHT BEFORE YOUR SURGERY  4.  5.  6.  ____Fleets enema or Magnesium Citrate as directed.   _x___ Use CHG Soap or sage wipes as directed on instruction sheet   _X___ Use inhalers on the day of surgery and bring to hospital day of surgery-USE YOUR ANORO ELLIPTA AND YOUR PROAIR INHALER THE DAY OF SURGERY AND BRING YOUR PROAIR INHALER TO  THE HOSPITAL  ____ Stop Metformin and Janumet 2 days prior to surgery.    ____ Take 1/2 of usual insulin dose the night before surgery and none on the morning surgery.   ____ Follow recommendations from Cardiologist, Pulmonologist or PCP regarding stopping Aspirin, Coumadin, Plavix ,Eliquis, Effient, or Pradaxa, and Pletal.  X____Stop Anti-inflammatories such as Advil, Aleve, Ibuprofen, Motrin, Naproxen, Naprosyn, Goodies powders or aspirin products NOW-OK to take Tylenol OR HYDROCODONE IF NEEDED   ____ Stop supplements until after surgery.     ____ Bring C-Pap to the hospital.

## 2020-05-24 ENCOUNTER — Inpatient Hospital Stay: Admission: RE | Admit: 2020-05-24 | Payer: Medicare Other | Source: Ambulatory Visit

## 2020-05-24 ENCOUNTER — Other Ambulatory Visit: Payer: Medicare Other

## 2020-05-24 ENCOUNTER — Other Ambulatory Visit: Admission: RE | Admit: 2020-05-24 | Payer: Medicare Other | Source: Ambulatory Visit

## 2020-05-26 ENCOUNTER — Encounter: Admission: RE | Payer: Self-pay | Source: Home / Self Care

## 2020-05-26 ENCOUNTER — Ambulatory Visit: Admission: RE | Admit: 2020-05-26 | Payer: Medicare Other | Source: Home / Self Care | Admitting: Surgery

## 2020-05-26 SURGERY — EXCISION, GANGLION CYST, WRIST
Anesthesia: Choice | Site: Wrist | Laterality: Left

## 2020-11-10 ENCOUNTER — Other Ambulatory Visit (HOSPITAL_COMMUNITY): Payer: Self-pay | Admitting: Interventional Radiology

## 2020-11-10 DIAGNOSIS — Z9359 Other cystostomy status: Secondary | ICD-10-CM

## 2022-08-14 ENCOUNTER — Emergency Department: Payer: Medicare Other

## 2022-08-14 ENCOUNTER — Emergency Department
Admission: EM | Admit: 2022-08-14 | Discharge: 2022-08-14 | Discharge status: Against Medical Advice | Payer: Medicare Other | Attending: Emergency Medicine | Admitting: Emergency Medicine

## 2022-08-14 ENCOUNTER — Other Ambulatory Visit: Payer: Self-pay

## 2022-08-14 DIAGNOSIS — Z1152 Encounter for screening for COVID-19: Secondary | ICD-10-CM | POA: Diagnosis not present

## 2022-08-14 DIAGNOSIS — Z5321 Procedure and treatment not carried out due to patient leaving prior to being seen by health care provider: Secondary | ICD-10-CM | POA: Insufficient documentation

## 2022-08-14 DIAGNOSIS — R059 Cough, unspecified: Secondary | ICD-10-CM | POA: Diagnosis not present

## 2022-08-14 DIAGNOSIS — R0602 Shortness of breath: Secondary | ICD-10-CM | POA: Diagnosis present

## 2022-08-14 LAB — BASIC METABOLIC PANEL
Anion gap: 11 (ref 5–15)
BUN: 11 mg/dL (ref 6–20)
CO2: 26 mmol/L (ref 22–32)
Calcium: 8.9 mg/dL (ref 8.9–10.3)
Chloride: 100 mmol/L (ref 98–111)
Creatinine, Ser: 1.06 mg/dL (ref 0.61–1.24)
GFR, Estimated: >60 mL/min (ref >60)
Glucose, Bld: 71 mg/dL (ref 70–99)
Potassium: 4.7 mmol/L (ref 3.5–5.1)
Sodium: 137 mmol/L (ref 135–145)

## 2022-08-14 LAB — CBC
HCT: 44.1 % (ref 39.0–52.0)
Hemoglobin: 14.9 g/dL (ref 13.0–17.0)
MCH: 30 pg (ref 26.0–34.0)
MCHC: 33.8 g/dL (ref 30.0–36.0)
MCV: 88.7 fL (ref 80.0–100.0)
Platelets: 182 10*3/uL (ref 150–400)
RBC: 4.97 MIL/uL (ref 4.22–5.81)
RDW: 14.2 % (ref 11.5–15.5)
WBC: 6.2 10*3/uL (ref 4.0–10.5)
nRBC: 0 % (ref 0.0–0.2)

## 2022-08-14 LAB — TROPONIN I (HIGH SENSITIVITY): Troponin I (High Sensitivity): 4 ng/L (ref <18)

## 2022-08-14 LAB — SARS CORONAVIRUS 2 BY RT PCR: SARS Coronavirus 2 by RT PCR: NEGATIVE

## 2022-08-14 MED ORDER — ALBUTEROL SULFATE (2.5 MG/3ML) 0.083% IN NEBU: 3.0000 mL | INHALATION_SOLUTION | RESPIRATORY_TRACT | Status: DC | PRN

## 2022-08-14 NOTE — ED Triage Notes (Signed)
EMS brings pt in from home for c/o cough this morning

## 2022-08-14 NOTE — ED Notes (Signed)
Pt called to room, pt not found, checked outside and in bathroom.

## 2022-08-14 NOTE — ED Notes (Signed)
Pt walking around in lobby, loudly coughing and yelling that he cant wait any longer. Pt advised that there were pts in the lobby that have been waiting upwards of 8hrs. Pt states that he is leaving but continues to walk around lobby.

## 2022-08-14 NOTE — ED Notes (Signed)
Pt o2 sat 89%. Pt placed on 2L, O2 sat 93%

## 2022-08-14 NOTE — ED Triage Notes (Signed)
Pt to ED via [facility] c/o SOB. Pt has a cough that started 0330 this morning. Pt states SOB started 0330. Denies CP.

## 2022-09-15 ENCOUNTER — Emergency Department
Admission: EM | Admit: 2022-09-15 | Discharge: 2022-09-15 | Disposition: A | Discharge status: Home / Self Care | Payer: Medicare Other | Attending: Emergency Medicine | Admitting: Emergency Medicine

## 2022-09-15 ENCOUNTER — Encounter: Payer: Self-pay | Admitting: Emergency Medicine

## 2022-09-15 ENCOUNTER — Other Ambulatory Visit: Payer: Self-pay

## 2022-09-15 ENCOUNTER — Emergency Department: Payer: Medicare Other

## 2022-09-15 DIAGNOSIS — R0602 Shortness of breath: Secondary | ICD-10-CM

## 2022-09-15 DIAGNOSIS — J449 Chronic obstructive pulmonary disease, unspecified: Secondary | ICD-10-CM | POA: Insufficient documentation

## 2022-09-15 DIAGNOSIS — Z20822 Contact with and (suspected) exposure to covid-19: Secondary | ICD-10-CM | POA: Diagnosis not present

## 2022-09-15 DIAGNOSIS — R079 Chest pain, unspecified: Secondary | ICD-10-CM | POA: Insufficient documentation

## 2022-09-15 DIAGNOSIS — R509 Fever, unspecified: Secondary | ICD-10-CM | POA: Diagnosis not present

## 2022-09-15 DIAGNOSIS — R059 Cough, unspecified: Secondary | ICD-10-CM | POA: Insufficient documentation

## 2022-09-15 LAB — COMPREHENSIVE METABOLIC PANEL
ALT: 46 U/L — ABNORMAL HIGH (ref 0–44)
AST: 77 U/L — ABNORMAL HIGH (ref 15–41)
Albumin: 4.4 g/dL (ref 3.5–5.0)
Alkaline Phosphatase: 77 U/L (ref 38–126)
Anion gap: 8 (ref 5–15)
BUN: 15 mg/dL (ref 6–20)
CO2: 24 mmol/L (ref 22–32)
Calcium: 8.8 mg/dL — ABNORMAL LOW (ref 8.9–10.3)
Chloride: 108 mmol/L (ref 98–111)
Creatinine, Ser: 1.08 mg/dL (ref 0.61–1.24)
GFR, Estimated: >60 mL/min (ref >60)
Glucose, Bld: 58 mg/dL — ABNORMAL LOW (ref 70–99)
Potassium: 4.4 mmol/L (ref 3.5–5.1)
Sodium: 140 mmol/L (ref 135–145)
Total Bilirubin: 0.9 mg/dL (ref 0.3–1.2)
Total Protein: 7.6 g/dL (ref 6.5–8.1)

## 2022-09-15 LAB — CBC
HCT: 45.7 % (ref 39.0–52.0)
Hemoglobin: 15.5 g/dL (ref 13.0–17.0)
MCH: 30.7 pg (ref 26.0–34.0)
MCHC: 33.9 g/dL (ref 30.0–36.0)
MCV: 90.5 fL (ref 80.0–100.0)
Platelets: 179 10*3/uL (ref 150–400)
RBC: 5.05 MIL/uL (ref 4.22–5.81)
RDW: 14.8 % (ref 11.5–15.5)
WBC: 3.8 10*3/uL — ABNORMAL LOW (ref 4.0–10.5)
nRBC: 0 % (ref 0.0–0.2)

## 2022-09-15 LAB — RESP PANEL BY RT-PCR (FLU A&B, COVID) ARPGX2
Influenza A by PCR: NEGATIVE
Influenza B by PCR: NEGATIVE
SARS Coronavirus 2 by RT PCR: NEGATIVE

## 2022-09-15 LAB — LIPASE, BLOOD: Lipase: 57 U/L — ABNORMAL HIGH (ref 11–51)

## 2022-09-15 LAB — D-DIMER, QUANTITATIVE: D-Dimer, Quant: 0.9 ug/mL-FEU — ABNORMAL HIGH (ref 0.00–0.50)

## 2022-09-15 LAB — TROPONIN I (HIGH SENSITIVITY): Troponin I (High Sensitivity): 4 ng/L (ref <18)

## 2022-09-15 MED ORDER — BACLOFEN 10 MG PO TABS
10.0000 mg | ORAL_TABLET | Freq: Once | ORAL | Status: DC
  Filled 2022-09-15: qty 1

## 2022-09-15 MED ORDER — METHYLPREDNISOLONE SODIUM SUCC 125 MG IJ SOLR
125.0000 mg | Freq: Once | INTRAMUSCULAR | Status: DC
  Filled 2022-09-15: qty 2

## 2022-09-15 MED ORDER — IPRATROPIUM-ALBUTEROL 0.5-2.5 (3) MG/3ML IN SOLN
3.0000 mL | Freq: Once | RESPIRATORY_TRACT | Status: DC
  Filled 2022-09-15: qty 3

## 2022-09-15 NOTE — ED Notes (Signed)
Pt alert, calmly sitting on stretcher, watching tv, asked for hospital phone, speaking in full sentences, no increased WOB or accessory muscle use noted, no acute distress noted, resp reg/unlabored, skin dry, pt states lung issues with no official dx x4 years, denies COPD, denies asthma, states is updated on covid vaccinations, states burning sensation in lungs bilaterally, denies decline during weather changes, states "it gets worse at the house and work with people not washing their hands", states pulmonologist has been following and has been arranging for him to have portable oxygen at home but things haven't been worked out yet with insurance, asked for "3 warm blankets"; stretcher locked low, rail up, call bell within reach, tv remote given to pt, door left open at pt's request, given 3 warm blankets and hospital phone. Pt denies any other needs currently.

## 2022-09-15 NOTE — ED Provider Notes (Addendum)
[facility] Provider Note    Event Date/Time   First MD Initiated Contact with Patient 09/15/22 [phone]     (approximate)   History   Shortness of Breath, Fever, and Cough   HPI  Corey Cortez is a 56 y.o. male with history of COPD who comes in with concerns for shortness of breath, fever, cough.  Patient reports not feeling well for the past 2 months but then he reports that he is living in a boardinghouse that since being that he has not been the same with his lungs.  He reports having a cough with some blood tinge to it over the past 2 months.  He states it is very minimal in nature but is not told he went about this previously.  Denies any history of blood clots or swelling in his legs.  He reports intermittent coughing, shortness of breath and a burning in his chest.  He does report that he is continuing to smoke.  While patient is tell me about the blood tinge in his sputum he interrupts me and states "can I have breakfast".  I explained to patient that if he is here for coughing up blood and this is potentially an emergent thing and that he cannot have any food.  He states was not very much it is very minimal in nature  On review of records patient was seen on 08/24/2022 and treated with prednisone, Doxy and albuterol inhaler.  They were given a referral for pulmonary   Physical Exam   Triage Vital Signs: ED Triage Vitals  Enc Vitals Group     BP 09/15/22 0928 (!) 119/91     Pulse Rate 09/15/22 0928 78     Resp 09/15/22 0928 20     Temp 09/15/22 0928 97.8 F (36.6 C)     Temp Source 09/15/22 0928 Oral     SpO2 09/15/22 0928 100 %     Weight 09/15/22 0926 185 lb (83.9 kg)     Height 09/15/22 0926 5\' 9"  (1.753 m)     Head Circumference --      Peak Flow --      Pain Score 09/15/22 0926 10     Pain Loc --      Pain Edu? --      Excl. in GC? --     Most recent vital signs: Vitals:   09/15/22 0939 09/15/22 0940  BP:    Pulse: 76   Resp: 13  15  Temp:    SpO2: 100%      General: Awake, no distress.  CV:  Good peripheral perfusion.  Resp:  Normal effort.  Minimal wheeze Abd:  No distention.  Soft and nontender Other:  No swelling the legs.  No calf tenderness.   ED Results / Procedures / Treatments   Labs (all labs ordered are listed, but only abnormal results are displayed) Labs Reviewed  RESP PANEL BY RT-PCR (FLU A&B, COVID) ARPGX2  BASIC METABOLIC PANEL  CBC  TROPONIN I (HIGH SENSITIVITY)     EKG  My interpretation of EKG:  Normal sinus rate of 79 without any ST elevation or T wave inversions, normal intervals  RADIOLOGY I have reviewed the xray personally and interpreted and no evidence of any pneumonia PROCEDURES:  Critical Care performed: No  .1-3 Lead EKG Interpretation  Performed by: 13/11/23, MD Authorized by: Concha Se, MD     Interpretation: normal     ECG rate:  70   ECG rate assessment: normal     Rhythm: sinus rhythm     Ectopy: none     Conduction: normal      MEDICATIONS ORDERED IN ED: Medications  ipratropium-albuterol (DUONEB) 0.5-2.5 (3) MG/3ML nebulizer solution 3 mL (has no administration in time range)  methylPREDNISolone sodium succinate (SOLU-MEDROL) 125 mg/2 mL injection 125 mg (has no administration in time range)  baclofen (LIORESAL) tablet 10 mg (has no administration in time range)     IMPRESSION / MDM / ASSESSMENT AND PLAN / ED COURSE  I reviewed the triage vital signs and the nursing notes.   Patient's presentation is most consistent with acute presentation with potential threat to life or bodily function.   Patient comes in with concern for shortness of breath though he is speaking in full sentences maybe a slight wheeze on examination oxygen levels are 100% without any accessory muscle use.  Suspect most likely COPD given he reports some blood-tinged sputum over the past few months we will get D-dimer to evaluate for pulmonary embolism.  Test for  COVID, flu, ACS.  Patient is lipase was slightly elevated.  White count was slightly low troponin was negative COVID test was negative D-dimer was positive glucose was slightly low.  I went back into discussed with patient elevated D-dimer and need to get CT PE.  He was okay with proceeding with this.  We will give him a little bit of apple juice because his sugars were low.  When the nurse went to go into given these things patient had eloped from the emergency room.  I was not able to have a further discussion with him and IV had already been removed from him.  I attempted to call his mobile number to let him know about sugar being slightly low but no answers.  It sounds like prior to patient leaving the hospital he was in the waiting room and the staff were able to give him some juice and food to help with the low sugar   The patient is on the cardiac monitor to evaluate for evidence of arrhythmia and/or significant heart rate changes.      FINAL CLINICAL IMPRESSION(S) / ED DIAGNOSES   Final diagnoses:  Shortness of breath     Rx / DC Orders   ED Discharge Orders     None        Note:  This document was prepared using Dragon voice recognition software and may include unintentional dictation errors.   Concha Se, MD 09/15/22 1140    Concha Se, MD 09/15/22 1144

## 2022-09-15 NOTE — ED Notes (Signed)
To bedside to place IV to collect blood-work and swab for resp panel. Pt leaving for imaging. Will catch pt's orders up once he is back to room.

## 2022-09-15 NOTE — ED Triage Notes (Signed)
Pt reports has been sick for 2 months now. Pt states that he saw his MD for it and they gave him a covid shot while he was sick and now he is worse. Pt reports his PMD is supposed to refer him to a pulmonologist and he is waiting on that. Pt reports he constantly feels burning in his lungs and cannot get comfortable.

## 2022-09-15 NOTE — ED Notes (Signed)
Corey Cortez was able to find pt in lobby and remove IV. Pt left AMA.

## 2022-09-15 NOTE — ED Notes (Signed)
To bedside with medications. NT states pt just walked down the hallway towards lobby stating "fuck y'all, I'm leaving". NT checking lobby for pt as this RN notified her that he had IV in his L arm and needs to be checked if he still has it.

## 2022-09-15 NOTE — ED Triage Notes (Signed)
Pt given orange juice x's 2 and sandwich tray. Pt advised he did not want to check back in

## 2022-09-15 NOTE — ED Notes (Signed)
Pt demanding ice chips, food and a different hospital phone stating current one not working. Explained probably cannot have food until doctor reviews results of images, told will check on ice chips for him and look for different phone. Pt making rude comments one minute then trying to flirt with this RN the next moment stating things like "you pull my chain... you ever been told you're pretty". Pt immediately hits call bell for ice chips and phone the second this RN leaves the room. Pt making rude comments to [employer]omatic Services operational officer.

## 2022-09-15 NOTE — ED Triage Notes (Signed)
Pt in via EMS from home with c/o difficulty breathing. EMS reports pt met them on the front porch. EMS reports slight wheezing. Pt states he used inhalers at home. EMS gave 1 duoneb en route. 143/82, 100% RA. HR 88

## 2022-09-15 NOTE — ED Notes (Signed)
Pt given another phone. Pharm tech at bedside reviewing meds.

## 2022-09-15 NOTE — ED Notes (Signed)
Pt not in room 13. Per notes from triage RN, pt refusing to check back in.

## 2022-09-15 NOTE — ED Notes (Signed)
NT just notified this RN who is currently in another room assisting another pt that this pt is using call bell and yelling out that he is in resp distress though continues to talk in full sentences, skin dry and resp reg/unlabored. This RN notified him she will finish emergent task she was in middle of in another room when responding to him and will come back to provide medications as soon as possible.

## 2022-09-26 ENCOUNTER — Emergency Department (EMERGENCY_DEPARTMENT_HOSPITAL)
Admission: EM | Admit: 2022-09-26 | Discharge: 2022-09-27 | Disposition: A | Discharge status: Other Acute Inpatient Hospital | Payer: Medicare Other | Source: Home / Self Care | Attending: Emergency Medicine | Admitting: Emergency Medicine

## 2022-09-26 ENCOUNTER — Other Ambulatory Visit: Payer: Self-pay

## 2022-09-26 ENCOUNTER — Encounter: Payer: Self-pay | Admitting: *Deleted

## 2022-09-26 DIAGNOSIS — R4689 Other symptoms and signs involving appearance and behavior: Secondary | ICD-10-CM

## 2022-09-26 DIAGNOSIS — J449 Chronic obstructive pulmonary disease, unspecified: Secondary | ICD-10-CM | POA: Insufficient documentation

## 2022-09-26 DIAGNOSIS — F17210 Nicotine dependence, cigarettes, uncomplicated: Secondary | ICD-10-CM | POA: Insufficient documentation

## 2022-09-26 DIAGNOSIS — Y904 Blood alcohol level of 80-99 mg/100 ml: Secondary | ICD-10-CM | POA: Insufficient documentation

## 2022-09-26 DIAGNOSIS — Z20822 Contact with and (suspected) exposure to covid-19: Secondary | ICD-10-CM | POA: Insufficient documentation

## 2022-09-26 DIAGNOSIS — F22 Delusional disorders: Secondary | ICD-10-CM | POA: Insufficient documentation

## 2022-09-26 DIAGNOSIS — F1090 Alcohol use, unspecified, uncomplicated: Secondary | ICD-10-CM | POA: Insufficient documentation

## 2022-09-26 DIAGNOSIS — I10 Essential (primary) hypertension: Secondary | ICD-10-CM | POA: Insufficient documentation

## 2022-09-26 DIAGNOSIS — Z789 Other specified health status: Secondary | ICD-10-CM

## 2022-09-26 DIAGNOSIS — F23 Brief psychotic disorder: Secondary | ICD-10-CM | POA: Insufficient documentation

## 2022-09-26 DIAGNOSIS — F10151 Alcohol abuse with alcohol-induced psychotic disorder with hallucinations: Secondary | ICD-10-CM | POA: Diagnosis not present

## 2022-09-26 LAB — URINE DRUG SCREEN, QUALITATIVE (ARMC ONLY)
Amphetamines, Ur Screen: NOT DETECTED
Barbiturates, Ur Screen: NOT DETECTED
Benzodiazepine, Ur Scrn: NOT DETECTED
Cannabinoid 50 Ng, Ur ~~LOC~~: NOT DETECTED
Cocaine Metabolite,Ur ~~LOC~~: NOT DETECTED
MDMA (Ecstasy)Ur Screen: NOT DETECTED
Methadone Scn, Ur: NOT DETECTED
Opiate, Ur Screen: NOT DETECTED
Phencyclidine (PCP) Ur S: NOT DETECTED
Tricyclic, Ur Screen: NOT DETECTED

## 2022-09-26 LAB — ACETAMINOPHEN LEVEL: Acetaminophen (Tylenol), Serum: <10 ug/mL — ABNORMAL LOW (ref 10–30)

## 2022-09-26 LAB — COMPREHENSIVE METABOLIC PANEL
ALT: 120 U/L — ABNORMAL HIGH (ref 0–44)
AST: 75 U/L — ABNORMAL HIGH (ref 15–41)
Albumin: 4.7 g/dL (ref 3.5–5.0)
Alkaline Phosphatase: 107 U/L (ref 38–126)
Anion gap: 10 (ref 5–15)
BUN: 13 mg/dL (ref 6–20)
CO2: 23 mmol/L (ref 22–32)
Calcium: 9.6 mg/dL (ref 8.9–10.3)
Chloride: 106 mmol/L (ref 98–111)
Creatinine, Ser: 1.11 mg/dL (ref 0.61–1.24)
GFR, Estimated: >60 mL/min (ref >60)
Glucose, Bld: 92 mg/dL (ref 70–99)
Potassium: 3.6 mmol/L (ref 3.5–5.1)
Sodium: 139 mmol/L (ref 135–145)
Total Bilirubin: 0.6 mg/dL (ref 0.3–1.2)
Total Protein: 8.4 g/dL — ABNORMAL HIGH (ref 6.5–8.1)

## 2022-09-26 LAB — CBC
HCT: 43.6 % (ref 39.0–52.0)
Hemoglobin: 14.8 g/dL (ref 13.0–17.0)
MCH: 30.9 pg (ref 26.0–34.0)
MCHC: 33.9 g/dL (ref 30.0–36.0)
MCV: 91 fL (ref 80.0–100.0)
Platelets: 200 10*3/uL (ref 150–400)
RBC: 4.79 MIL/uL (ref 4.22–5.81)
RDW: 14.2 % (ref 11.5–15.5)
WBC: 4.9 10*3/uL (ref 4.0–10.5)
nRBC: 0 % (ref 0.0–0.2)

## 2022-09-26 LAB — SARS CORONAVIRUS 2 BY RT PCR: SARS Coronavirus 2 by RT PCR: NEGATIVE

## 2022-09-26 LAB — ETHANOL: Alcohol, Ethyl (B): 85 mg/dL — ABNORMAL HIGH (ref <10)

## 2022-09-26 LAB — SALICYLATE LEVEL: Salicylate Lvl: <7 mg/dL — ABNORMAL LOW (ref 7.0–30.0)

## 2022-09-26 MED ORDER — HYDROCODONE-ACETAMINOPHEN 5-325 MG PO TABS
1.0000 | ORAL_TABLET | Freq: Three times a day (TID) | ORAL | Status: DC | PRN
  Administered 2022-09-26: 1 via ORAL
  Filled 2022-09-26: qty 1

## 2022-09-26 MED ORDER — HYDROCODONE-ACETAMINOPHEN 5-325 MG PO TABS: 1.0000 | ORAL_TABLET | Freq: Once | ORAL | Status: DC

## 2022-09-26 MED ORDER — BACLOFEN 10 MG PO TABS
10.0000 mg | ORAL_TABLET | Freq: Three times a day (TID) | ORAL | Status: DC
  Administered 2022-09-26: 10 mg via ORAL
  Filled 2022-09-26: qty 1

## 2022-09-26 MED ORDER — NAPROXEN 250 MG PO TABS: 500.0000 mg | ORAL_TABLET | Freq: Two times a day (BID) | ORAL | Status: DC | PRN
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
## 2022-09-26 NOTE — ED Notes (Signed)
Psych team in room at this time.  
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
## 2022-09-26 NOTE — ED Notes (Addendum)
White tank top Black socks Camouflage shorts Black flip flops Black hat Wallace Cullens colored necklace with cross charm Cell phone keys
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
## 2022-09-26 NOTE — ED Notes (Signed)
Pt ambulated to bathroom independently

## 2022-09-27 ENCOUNTER — Inpatient Hospital Stay
Admission: AD | Admit: 2022-09-27 | Discharge: 2022-10-01 | DRG: 897 | Disposition: A | Discharge status: Home / Self Care | Payer: Medicare Other | Source: Intra-hospital | Attending: Psychiatry | Admitting: Psychiatry

## 2022-09-27 ENCOUNTER — Other Ambulatory Visit: Payer: Self-pay

## 2022-09-27 DIAGNOSIS — Z79899 Other long term (current) drug therapy: Secondary | ICD-10-CM | POA: Diagnosis not present

## 2022-09-27 DIAGNOSIS — J449 Chronic obstructive pulmonary disease, unspecified: Secondary | ICD-10-CM | POA: Diagnosis present

## 2022-09-27 DIAGNOSIS — R748 Abnormal levels of other serum enzymes: Secondary | ICD-10-CM | POA: Diagnosis present

## 2022-09-27 DIAGNOSIS — I10 Essential (primary) hypertension: Secondary | ICD-10-CM | POA: Diagnosis present

## 2022-09-27 DIAGNOSIS — R252 Cramp and spasm: Secondary | ICD-10-CM | POA: Diagnosis not present

## 2022-09-27 DIAGNOSIS — Z888 Allergy status to other drugs, medicaments and biological substances status: Secondary | ICD-10-CM

## 2022-09-27 DIAGNOSIS — F10151 Alcohol abuse with alcohol-induced psychotic disorder with hallucinations: Principal | ICD-10-CM | POA: Diagnosis present

## 2022-09-27 DIAGNOSIS — F419 Anxiety disorder, unspecified: Secondary | ICD-10-CM | POA: Diagnosis present

## 2022-09-27 DIAGNOSIS — F23 Brief psychotic disorder: Secondary | ICD-10-CM | POA: Diagnosis present

## 2022-09-27 DIAGNOSIS — Z1152 Encounter for screening for COVID-19: Secondary | ICD-10-CM | POA: Diagnosis not present

## 2022-09-27 DIAGNOSIS — K219 Gastro-esophageal reflux disease without esophagitis: Secondary | ICD-10-CM | POA: Diagnosis present

## 2022-09-27 DIAGNOSIS — F17210 Nicotine dependence, cigarettes, uncomplicated: Secondary | ICD-10-CM | POA: Diagnosis present

## 2022-09-27 DIAGNOSIS — F32A Depression, unspecified: Secondary | ICD-10-CM | POA: Diagnosis present

## 2022-09-27 DIAGNOSIS — R456 Violent behavior: Secondary | ICD-10-CM | POA: Diagnosis present

## 2022-09-27 LAB — LIPID PANEL
Cholesterol: 168 mg/dL (ref 0–200)
HDL: 55 mg/dL (ref >40)
LDL Cholesterol: 79 mg/dL (ref 0–99)
Total CHOL/HDL Ratio: 3.1 RATIO
Triglycerides: 171 mg/dL — ABNORMAL HIGH (ref <150)
VLDL: 34 mg/dL (ref 0–40)

## 2022-09-27 LAB — TSH: TSH: 1.144 u[IU]/mL (ref 0.350–4.500)

## 2022-09-27 MED ORDER — RISPERIDONE 1 MG PO TBDP
2.0000 mg | ORAL_TABLET | Freq: Every day | ORAL | Status: DC
  Filled 2022-09-27: qty 2

## 2022-09-27 MED ORDER — ALBUTEROL SULFATE (2.5 MG/3ML) 0.083% IN NEBU
2.5000 mg | INHALATION_SOLUTION | Freq: Four times a day (QID) | RESPIRATORY_TRACT | Status: DC | PRN
  Administered 2022-09-27: 2.5 mg via RESPIRATORY_TRACT

## 2022-09-27 MED ORDER — RISPERIDONE 1 MG PO TBDP
0.5000 mg | ORAL_TABLET | Freq: Two times a day (BID) | ORAL | Status: DC
  Administered 2022-09-27 (×2): 0.5 mg via ORAL
  Filled 2022-09-27 (×3): qty 0.5

## 2022-09-27 MED ORDER — ALUM & MAG HYDROXIDE-SIMETH 200-200-20 MG/5ML PO SUSP: 30.0000 mL | ORAL | Status: DC | PRN

## 2022-09-27 MED ORDER — HYDROCODONE-ACETAMINOPHEN 5-325 MG PO TABS
1.0000 | ORAL_TABLET | Freq: Three times a day (TID) | ORAL | Status: DC | PRN
  Administered 2022-09-27: 1 via ORAL
  Filled 2022-09-27: qty 1

## 2022-09-27 MED ORDER — OLANZAPINE 10 MG IM SOLR: 10.0000 mg | Freq: Every day | INTRAMUSCULAR | Status: DC | PRN

## 2022-09-27 MED ORDER — MAGNESIUM HYDROXIDE 400 MG/5ML PO SUSP: 30.0000 mL | Freq: Every day | ORAL | Status: DC | PRN

## 2022-09-27 MED ORDER — BACLOFEN 10 MG PO TABS
10.0000 mg | ORAL_TABLET | Freq: Three times a day (TID) | ORAL | Status: DC
  Administered 2022-09-27 – 2022-10-01 (×13): 10 mg via ORAL
  Filled 2022-09-27 (×15): qty 1

## 2022-09-27 MED ORDER — ALBUTEROL SULFATE HFA 108 (90 BASE) MCG/ACT IN AERS
2.0000 | INHALATION_SPRAY | Freq: Four times a day (QID) | RESPIRATORY_TRACT | Status: DC | PRN
  Administered 2022-09-27 – 2022-10-01 (×13): 2 via RESPIRATORY_TRACT
  Filled 2022-09-27: qty 6.7

## 2022-09-27 MED ORDER — HYDROCODONE-ACETAMINOPHEN 5-325 MG PO TABS
1.0000 | ORAL_TABLET | Freq: Two times a day (BID) | ORAL | Status: DC | PRN
  Administered 2022-09-27 – 2022-09-28 (×3): 1 via ORAL
  Filled 2022-09-27 (×3): qty 1

## 2022-09-27 MED ORDER — OLANZAPINE 5 MG PO TABS: 10.0000 mg | ORAL_TABLET | Freq: Every day | ORAL | Status: DC | PRN

## 2022-09-27 MED ORDER — LORAZEPAM 1 MG PO TABS: 1.0000 mg | ORAL_TABLET | Freq: Four times a day (QID) | ORAL | Status: DC | PRN

## 2022-09-27 MED ORDER — ACETAMINOPHEN 325 MG PO TABS
650.0000 mg | ORAL_TABLET | Freq: Four times a day (QID) | ORAL | Status: DC | PRN
  Administered 2022-09-27 – 2022-10-01 (×8): 650 mg via ORAL
  Filled 2022-09-27 (×8): qty 2
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
## 2022-09-27 NOTE — Plan of Care (Signed)
  Problem: Education: Goal: Knowledge of [place] [facility] Education information/materials will improve Outcome: Not Progressing  Patient has not had time to progress he is a new admit.

## 2022-09-27 NOTE — ED Notes (Signed)
Pt belongings bag 1 of 1 taken to locked storage unit in [facility] and placed in bin appropriately labeled with Rm 22 by this RN.

## 2022-09-27 NOTE — Plan of Care (Signed)
Pt was mildly anxious/agitated throughout shift.  Pt began shift by asking for inhaler, tylenol (for pain "10") and his slides.  This Clinical research associate, reinforced with Pt to not get the slides wet in the shower then walk with them on the unit.  Pt confirmed that he did not plan to wear them in the shower.  Pt has a foot injury and wearing only socks was making it uncomfortable to walk.   Pt request inhaler again mid shift (around 2 AM) and required teaching as to the use of the inhaler.  Pt pumped the actuator in multiple brief bursts while inhaling; RN reminded Pt of the appropriate way to use the inhaler and Pt said "I know but this is the only thing that works for me."  Pt with loose chest/throat congestion apparent and head of bed was raised for comfort and safety.  Pt was OOB multiple times during the night at different points requesting tylenol, scrubs, towels, and a bandaid for his knee,  Pt states he showered and the bandaid came off his knee.  He was provided with a bandaid and instructed to ask the RN if another is needed.    Overall Pt is restless and insistant that he does not belong here.  Pt is compliant with medications and generally cooperative, but states "I have a doctor appointment and a job next week that I must be out of here for."  Pt was instructed to discuss his concerns with the provider.  He denies SI, HI, AVH.    Q73m monitoring continues for safety.

## 2022-09-27 NOTE — [facility] Suicide Risk Assessment (Signed)
[facility] INPATIENT:  Family/Significant Other Suicide Prevention Education  Suicide Prevention Education:  Patient Refusal for Family/Significant Other Suicide Prevention Education: The patient Corey Cortez has refused to provide written consent for family/significant other to be provided Family/Significant Other Suicide Prevention Education during admission and/or prior to discharge.  Physician notified.  Corky Crafts 09/27/2022, 10:44 AM

## 2022-09-27 NOTE — Tx Team (Signed)
Initial Treatment Plan 09/27/2022 3:46 AM Corey Cortez VQX:[id number]    PATIENT STRESSORS: Health problems   Other: anger management issues     PATIENT STRENGTHS: Capable of independent living  Supportive family/friends    PATIENT IDENTIFIED PROBLEMS: Ineffective coping skills  Improve health issues                   DISCHARGE CRITERIA:  Improved stabilization in mood, thinking, and/or behavior Motivation to continue treatment in a less acute level of care  PRELIMINARY DISCHARGE PLAN: Outpatient therapy Return to previous living arrangement  PATIENT/FAMILY INVOLVEMENT: This treatment plan has been presented to and reviewed with the patient, Corey Cortez.  The patient has been given the opportunity to ask questions and make suggestions.  Huel Cote, RN 09/27/2022, 3:46 AM

## 2022-09-27 NOTE — Progress Notes (Signed)
Patient in dayroom at start of shift.  Patient yelled from dayroom to tell staff the TV channel needed to be changed.   Nursing staff explained to patient yelling was not allowed and to come to nurse's station with any needs.  Patient denies SI,HI,AVH anxiety and depression.  Another patient reported he called her a "white cracker b____."  This behavior was addressed with patient and he was told that behavior would not be tolerated.  Patient had a good appetite this morning. Compliant with scheduled medications. 15 min checks in place.  Patient is safe on the unit.     Patient irritable upon approach to nurse's station. "This is a complete waste of my time.  I am 56 years old. No one tells me what I can and cannot do.  I'm ready to go home, drink some vodka, smoke a fat joint and call some hoes."  When staff attempted to re-direct patient he stated, "I don't want to hear that bullshit."  Patient currently sitting in dayroom.   Patient also stated he has a doctor's appointment Monday.  "I need to get the hell up out of here."  Patient came up to nurse's station demanding his slides to wear in the shower.  Nursing staff attempted to explain these slides (wet) were a fall risk.  Patient became agitated, yelling and cursing at staff.  Patient directed to go to his room.
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
## 2022-09-27 NOTE — ED Provider Notes (Signed)
-----------------------------------------   12:46 AM on 09/27/2022 -----------------------------------------   Patient accepted to [facility] geropsychiatric unit.   Irean Hong, MD 09/27/22 [phone]

## 2022-09-27 NOTE — BH Assessment (Addendum)
Patient is to be admitted to [facility] by Psychiatric Nurse Practitioner Gillermo Murdoch.  Attending Physician will be Dr.  Marlou Porch .   Patient has been assigned to room L30, by [facility] Charge Nurse Ovidio Kin    ER staff is aware of the admission: [facility] ER Secretary   Dr. Dolores Frame, ER MD  Herbert Seta Patient's Nurse  Rosey Bath Patient Access.

## 2022-09-27 NOTE — ED Notes (Signed)
Pt belongings retrieved from [facility] locked storage by NT for transfer to geropsych unit.
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
## 2022-09-27 NOTE — Group Note (Signed)
LCSW Group Therapy Note  Group Date: 09/27/2022 Start Time: 1300 End Time: 1400   Type of Therapy and Topic:  Group Therapy: Using "I" Statements  Participation Level:  Did Not Attend  Description of Group:  Patients were asked to provide details of some interpersonal conflicts they have experienced. Patients were then educated about "I" statements, communication which focuses on feelings or views of the speaker rather than what the other person is doing. T group members were asked to reflect on past conflicts and to provide specific examples for utilizing "I" statements.  Therapeutic Goals:  Patients will verbalize understanding of ineffective communication and effective communication. Patients will be able to empathize with whom they are having conflict. Patients will practice effective communication in the form of "I" statements.    Summary of Patient Progress:  Patients provided with worksheet to assist them in writing a letter of appreciation in observance of thanksgivings day. Patient's spending time amongst each other watching the Thanksgivings day football games in day room.   Therapeutic Modalities:   Cognitive Behavioral Therapy Solution-Focused Therapy    Jolinda Pinkstaff W Traci Gafford, LCSW 09/27/2022  1:42 PM    
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
## 2022-09-28 DIAGNOSIS — F10151 Alcohol abuse with alcohol-induced psychotic disorder with hallucinations: Secondary | ICD-10-CM | POA: Diagnosis not present

## 2022-09-28 LAB — HEMOGLOBIN A1C
Hgb A1c MFr Bld: 5.2 % (ref 4.8–5.6)
Mean Plasma Glucose: 103 mg/dL

## 2022-09-28 MED ORDER — RISPERIDONE 1 MG PO TBDP
1.0000 mg | ORAL_TABLET | Freq: Two times a day (BID) | ORAL | Status: DC
  Administered 2022-09-28 – 2022-09-29 (×3): 1 mg via ORAL
  Filled 2022-09-28 (×4): qty 1

## 2022-09-28 NOTE — Group Note (Addendum)
LCSW Group Therapy Note  Group Date: 09/28/2022 Start Time: 1300 End Time: 1345   Type of Therapy and Topic:  Group Therapy - Healthy vs Unhealthy Coping Skills  Participation Level:  Active   Description of Group The focus of this group was to determine what unhealthy coping techniques typically are used by group members and what healthy coping techniques would be helpful in coping with various problems. Patients were guided in becoming aware of the differences between healthy and unhealthy coping techniques. Patients were asked to identify 2-3 healthy coping skills they would like to learn to use more effectively.  Therapeutic Goals Patients learned that coping is what human beings do all day long to deal with various situations in their lives Patients defined and discussed healthy vs unhealthy coping techniques Patients identified their preferred coping techniques and identified whether these were healthy or unhealthy Patients determined 2-3 healthy coping skills they would like to become more familiar with and use more often. Patients provided support and ideas to each other   Summary of Patient Progress:  CSW provided pt with coping skills therapy packets to complete as well as positivity pack affirmation cards. He has fair judgement and fair insight regarding material. He is tangential but redirectable with elevated mood.    Therapeutic Modalities Cognitive Behavioral Therapy Motivational Interviewing  Tamana Hatfield A [place], Theresia Majors 09/28/2022  1:58 PM

## 2022-09-28 NOTE — Plan of Care (Signed)
D- Patient alert and oriented. Patient presents with a pleasant mood and affect. Patient flirtatious with Clinical research associate during morning assessment. Writer reminded patient of inappropriate behaviors.  Patient denies SI, HI, AVH, but endorses head pain. PRN pain medication administered. Patient stated his goal is to go home today. No discharge date noted during rounds this morning.   A- Scheduled medications administered to patient, per MD orders. Support and encouragement provided.  Routine safety checks conducted every 15 minutes.  Patient informed to notify staff with problems or concerns.  R- No adverse drug reactions noted. Patient contracts for safety at this time. Patient compliant with medications and treatment plan. Patient receptive, calm, and cooperative. Patient interacts well with others on the unit.  Patient remains safe at this time.   Problem: Nutrition: Goal: Adequate nutrition will be maintained Outcome: Progressing   Problem: Coping: Goal: Level of anxiety will decrease Outcome: Progressing   Problem: Elimination: Goal: Will not experience complications related to bowel motility Outcome: Progressing Goal: Will not experience complications related to urinary retention Outcome: Progressing   Problem: Education: Goal: Emotional status will improve Outcome: Progressing Goal: Mental status will improve Outcome: Progressing   Problem: Activity: Goal: Interest or engagement in activities will improve Outcome: Progressing

## 2022-09-28 NOTE — Progress Notes (Signed)
[facility] MD Progress Note  09/28/2022 10:25 AM Corey Cortez  MRN:  [id number]  Subjective:  Corey Cortez was seen this morning in rounds stating "I feel fantastic. I am ready to go. When can I go?" Asking to leave so that he can sue his landlord. "I had roaches coming our of my ears and I'm going to sue." This is not the case.  Reports difficulty staying asleep. "Slept from 10 p.m. to 2;30 a.m. and then took a shower." Does report eventually falling asleep afterwards until morning, but is willing to take something if possible reporting he usually self-medicates with Benadryl. States he is "eating like a champ."  Denies depression and/or anxiety. Denies side effects from medications. Denies suicidal ideation, denies homicidal ideation, and denies auditory and/or visual hallucinations. Reports no cravings for alcohol stating he only drinks occasionally and "do not have a problem."   Principal Problem: Alcohol abuse with alcohol-induced psychotic disorder, with hallucinations (HCC) Diagnosis: Principal Problem:   Alcohol abuse with alcohol-induced psychotic disorder, with hallucinations (HCC)  Total Time spent with patient: 30 minutes  Past Psychiatric History: substance abuse  Past Medical History:  Past Medical History:  Diagnosis Date   Arthritis    Ascites    AVM (arteriovenous malformation)    BRAIN   COPD (chronic obstructive pulmonary disease) (HCC)    Dyspnea    WITH EXERTION   Foot drop, right foot    FROM PRIOR BRAIN SURGERY   GERD (gastroesophageal reflux disease)    Hypertension    Occasional tremors    FROM BRAIN SURGERY   Seizures (HCC)    NO RECENT SEIZURES   Suprapubic catheter (HCC)     Past Surgical History:  Procedure Laterality Date   BRAIN SURGERY     BRAIN SURGERY Right    HERNIA REPAIR     LEG SURGERY  1990'S   BULLETT REMOVED FROM LEG   SUPRAPUBIC CATHETER INSERTION     Family History:  Family History  Family history unknown: Yes   Family Psychiatric   History: none Social History:  Social History   Substance and Sexual Activity  Alcohol Use Yes   Alcohol/week: 4.0 standard drinks of alcohol   Types: 4 Shots of liquor per week   Comment: OCC      Social History   Substance and Sexual Activity  Drug Use Yes   Types: Marijuana   Comment: OCC    Social History   Socioeconomic History   Marital status: Divorced    Spouse name: Not on file   Number of children: Not on file   Years of education: Not on file   Highest education level: Not on file  Occupational History   Not on file  Tobacco Use   Smoking status: Every Day    Packs/day: 0.50    Years: 40.00    Total pack years: 20.00    Types: Cigarettes   Smokeless tobacco: Never  Vaping Use   Vaping Use: Never used  Substance and Sexual Activity   Alcohol use: Yes    Alcohol/week: 4.0 standard drinks of alcohol    Types: 4 Shots of liquor per week    Comment: OCC    Drug use: Yes    Types: Marijuana    Comment: OCC   Sexual activity: Yes  Other Topics Concern   Not on file  Social History Narrative   Not on file   Social Determinants of Health   Financial Resource Strain: Not on file  Food Insecurity: Food Insecurity Present (09/27/2022)   Hunger Vital Sign    Worried About Running Out of Food in the Last Year: Sometimes true    Ran Out of Food in the Last Year: Sometimes true  Transportation Needs: No Transportation Needs (09/27/2022)   PRAPARE - Administrator, Civil Service (Medical): No    Lack of Transportation (Non-Medical): No  Physical Activity: Not on file  Stress: Not on file  Social Connections: Not on file   Additional Social History:                         Sleep: Good  Appetite:  Good  Current Medications: Current Facility-Administered Medications  Medication Dose Route Frequency Provider Last Rate Last Admin   acetaminophen (TYLENOL) tablet 650 mg  650 mg Oral Q6H PRN Gillermo Murdoch, NP   650 mg at  09/28/22 0436   albuterol (VENTOLIN HFA) 108 (90 Base) MCG/ACT inhaler 2 puff  2 puff Inhalation Q6H PRN Orson Aloe, RPH   2 puff at 09/28/22 0945   alum & mag hydroxide-simeth (MAALOX/MYLANTA) 200-200-20 MG/5ML suspension 30 mL  30 mL Oral Q4H PRN Gillermo Murdoch, NP       baclofen (LIORESAL) tablet 10 mg  10 mg Oral TID Gillermo Murdoch, NP   10 mg at 09/28/22 9622   HYDROcodone-acetaminophen (NORCO/VICODIN) 5-325 MG per tablet 1 tablet  1 tablet Oral Q12H PRN Charm Rings, NP   1 tablet at 09/27/22 2209   LORazepam (ATIVAN) tablet 1 mg  1 mg Oral Q6H PRN Charm Rings, NP       magnesium hydroxide (MILK OF MAGNESIA) suspension 30 mL  30 mL Oral Daily PRN Gillermo Murdoch, NP       OLANZapine (ZYPREXA) tablet 10 mg  10 mg Oral Daily PRN Charm Rings, NP       Or   OLANZapine (ZYPREXA) injection 10 mg  10 mg Intramuscular Daily PRN Charm Rings, NP       risperiDONE (RISPERDAL M-TABS) disintegrating tablet 1 mg  1 mg Oral BID Charm Rings, NP   1 mg at 09/28/22 2979    Lab Results:  Results for orders placed or performed during the hospital encounter of 09/27/22 (from the past 48 hour(s))  Hemoglobin A1c     Status: None   Collection Time: 09/26/22  6:31 PM  Result Value Ref Range   Hgb A1c MFr Bld 5.2 4.8 - 5.6 %    Comment: (NOTE)         Prediabetes: 5.7 - 6.4         Diabetes: >6.4         Glycemic control for adults with diabetes: <7.0    Mean Plasma Glucose 103 mg/dL    Comment: (NOTE) Performed At: [facility] [address] [place], [place] [id number] Jolene Schimke MD EY:[id number]   Lipid panel     Status: Abnormal   Collection Time: 09/26/22  6:31 PM  Result Value Ref Range   Cholesterol 168 0 - 200 mg/dL   Triglycerides 149 (H) <150 mg/dL   HDL 55 >70 mg/dL   Total CHOL/HDL Ratio 3.1 RATIO   VLDL 34 0 - 40 mg/dL   LDL Cholesterol 79 0 - 99 mg/dL    Comment:        Total Cholesterol/HDL:CHD Risk Coronary Heart Disease  Risk Table  Men   Women  1/2 Average Risk   3.4   3.3  Average Risk       5.0   4.4  2 X Average Risk   9.6   7.1  3 X Average Risk  23.4   11.0        Use the calculated Patient Ratio above and the CHD Risk Table to determine the patient's CHD Risk.        ATP III CLASSIFICATION (LDL):  <100     mg/dL   Optimal  [id number]  mg/dL   Near or Above                    Optimal  130-159  mg/dL   Borderline  [id number]  mg/dL   High  >528>190     mg/dL   Very High Performed at [facility], [address] [place], [place] [address]   TSH     Status: None   Collection Time: 09/26/22  6:31 PM  Result Value Ref Range   TSH 1.144 0.350 - 4.500 uIU/mL    Comment: Performed by a 3rd Generation assay with a functional sensitivity of <=0.01 uIU/mL. Performed at [facility], [address] [place], [place] [address]     Blood Alcohol level:  Lab Results  Component Value Date   ETH 85 (H) 09/26/2022   ETH <10 09/10/2019    Metabolic Disorder Labs: Lab Results  Component Value Date   HGBA1C 5.2 09/26/2022   MPG 103 09/26/2022   No results found for: "PROLACTIN" Lab Results  Component Value Date   CHOL 168 09/26/2022   TRIG 171 (H) 09/26/2022   HDL 55 09/26/2022   CHOLHDL 3.1 09/26/2022   VLDL 34 09/26/2022   LDLCALC 79 09/26/2022    Physical Findings: AIMS:  , ,  ,  ,    CIWA:  CIWA-Ar Total: 3 COWS:     Musculoskeletal: Strength & Muscle Tone: within normal limits Gait & Station: normal Patient leans: N/A  Psychiatric Specialty Exam: Physical Exam Vitals and nursing note reviewed.  Constitutional:      Appearance: Normal appearance.  HENT:     Head: Normocephalic.     Nose: Nose normal.  Pulmonary:     Effort: Pulmonary effort is normal.  Musculoskeletal:        General: Normal range of motion.     Cervical back: Normal range of motion.  Neurological:     General: No focal deficit present.     Mental Status: He is alert and  oriented to person, place, and time.  Psychiatric:        Attention and Perception: Attention and perception normal.        Mood and Affect: Mood is elated.        Speech: Speech normal.        Behavior: Behavior normal. Behavior is cooperative.        Thought Content: Thought content is delusional.        Cognition and Memory: Cognition and memory normal.        Judgment: Judgment is inappropriate.     Review of Systems  Psychiatric/Behavioral:  The patient is nervous/anxious.   All other systems reviewed and are negative.   Blood pressure 126/87, pulse 64, temperature 97.7 F (36.5 C), temperature source Oral, resp. rate 18, height 5\' 9"  (1.753 m), weight 75.5 kg, SpO2 100 %.Body mass index is 24.59 kg/m.  General Appearance: Casual  Eye Contact:  Good  Speech:  Normal Rate  Volume:  Normal  Mood:  Anxious, euphoric  Affect:  Congruent  Thought Process:  Coherent and Descriptions of Associations: Tangential at times  Orientation:  Full (Time, Place, and Person)  Thought Content:  Delusions  Suicidal Thoughts:  No  Homicidal Thoughts:  No  Memory:  Immediate;   Fair Recent;   Fair Remote;   Fair  Judgement:  Fair  Insight:  Lacking  Psychomotor Activity:  Increased  Concentration:  Concentration: Fair and Attention Span: Fair  Recall:  [employer] of Knowledge:  Fair  Language:  Good  Akathisia:  No  Handed:  Right  AIMS (if indicated):     Assets:  Leisure Time Physical Health Resilience  ADL's:  Intact  Cognition:  WNL  Sleep:         Physical Exam: Physical Exam Vitals and nursing note reviewed.  Constitutional:      Appearance: Normal appearance.  HENT:     Head: Normocephalic.     Nose: Nose normal.  Pulmonary:     Effort: Pulmonary effort is normal.  Musculoskeletal:        General: Normal range of motion.     Cervical back: Normal range of motion.  Neurological:     General: No focal deficit present.     Mental Status: He is alert and oriented  to person, place, and time.  Psychiatric:        Attention and Perception: Attention and perception normal.        Mood and Affect: Mood is elated.        Speech: Speech normal.        Behavior: Behavior normal. Behavior is cooperative.        Thought Content: Thought content is delusional.        Cognition and Memory: Cognition and memory normal.        Judgment: Judgment is inappropriate.    Review of Systems  Psychiatric/Behavioral:  The patient is nervous/anxious.   All other systems reviewed and are negative.  Blood pressure 126/87, pulse 64, temperature 97.7 F (36.5 C), temperature source Oral, resp. rate 18, height 5\' 9"  (1.753 m), weight 75.5 kg, SpO2 100 %. Body mass index is 24.59 kg/m.   Treatment Plan Summary: Daily contact with patient to assess and evaluate symptoms and progress in treatment, Medication management, and Plan : Alcohol abuse with alcohol induced psychosis with hallucinations: CIWA started with PRN Ativan in place for any withdrawal symptoms   Paranoia: Started Risperdal 0.5 mg BID  , NP 09/28/2022, 10:25 AM

## 2022-09-28 NOTE — BH IP Treatment Plan (Signed)
Interdisciplinary Treatment and Diagnostic Plan Update  09/28/2022 Time of Session: 9:30AM Shaman Muscarella MRN: [id number]  Principal Diagnosis: Alcohol abuse with alcohol-induced psychotic disorder, with hallucinations ([place])  Secondary Diagnoses: Principal Problem:   Alcohol abuse with alcohol-induced psychotic disorder, with hallucinations ([place])   Current Medications:  Current Facility-Administered Medications  Medication Dose Route Frequency Provider Last Rate Last Admin   acetaminophen (TYLENOL) tablet 650 mg  650 mg Oral Q6H PRN Caroline Sauger, NP   650 mg at 09/28/22 0436   albuterol (VENTOLIN HFA) 108 (90 Base) MCG/ACT inhaler 2 puff  2 puff Inhalation Q6H PRN Delena Bali, RPH   2 puff at 09/28/22 0200   alum & mag hydroxide-simeth (MAALOX/MYLANTA) 200-200-20 MG/5ML suspension 30 mL  30 mL Oral Q4H PRN Caroline Sauger, NP       baclofen (LIORESAL) tablet 10 mg  10 mg Oral TID Caroline Sauger, NP   10 mg at 09/28/22 0928   HYDROcodone-acetaminophen (NORCO/VICODIN) 5-325 MG per tablet 1 tablet  1 tablet Oral Q12H PRN Patrecia Pour, NP   1 tablet at 09/27/22 2209   LORazepam (ATIVAN) tablet 1 mg  1 mg Oral Q6H PRN Patrecia Pour, NP       magnesium hydroxide (MILK OF MAGNESIA) suspension 30 mL  30 mL Oral Daily PRN Caroline Sauger, NP       OLANZapine (ZYPREXA) tablet 10 mg  10 mg Oral Daily PRN Patrecia Pour, NP       Or   OLANZapine (ZYPREXA) injection 10 mg  10 mg Intramuscular Daily PRN Patrecia Pour, NP       risperiDONE (RISPERDAL M-TABS) disintegrating tablet 1 mg  1 mg Oral BID Patrecia Pour, NP   1 mg at 09/28/22 3009   PTA Medications: Medications Prior to Admission  Medication Sig Dispense Refill Last Dose   ANORO ELLIPTA 62.5-25 MCG/INH AEPB Inhale 1 puff into the lungs every morning.  (Patient not taking: Reported on 09/15/2022)      baclofen (LIORESAL) 10 MG tablet Take 10 mg by mouth 3 (three) times daily.      docusate sodium  (COLACE) 100 MG capsule Take 100 mg by mouth 2 (two) times daily as needed for mild constipation.      HYDROcodone-acetaminophen (NORCO/VICODIN) 5-325 MG tablet Take 1 tablet by mouth 3 (three) times daily as needed.      Multiple Vitamins-Minerals (MULTIVITAMIN WITH MINERALS) tablet Take 1 tablet by mouth daily.      naproxen sodium (ALEVE) 220 MG tablet Take 440 mg by mouth 2 (two) times daily as needed (pain).      nitrofurantoin, macrocrystal-monohydrate, (MACROBID) 100 MG capsule Take 100 mg by mouth daily. (Patient not taking: Reported on 09/26/2022)      pantoprazole (PROTONIX) 40 MG tablet Take 80 mg by mouth daily before breakfast. (Patient not taking: Reported on 09/15/2022)      PROAIR HFA 108 (90 Base) MCG/ACT inhaler Inhale 2 puffs into the lungs every 6 (six) hours as needed for wheezing.      tamsulosin (FLOMAX) 0.4 MG CAPS capsule Take 0.4 mg by mouth daily after breakfast.  (Patient not taking: Reported on 09/15/2022)       Patient Stressors: Health problems   Other: anger management issues    Patient Strengths: Capable of independent living  Supportive family/friends   Treatment Modalities: Medication Management, Group therapy, Case management,  1 to 1 session with clinician, Psychoeducation, Recreational therapy.   Physician Treatment Plan for Primary Diagnosis:  Alcohol abuse with alcohol-induced psychotic disorder, with hallucinations ([place]) Long Term Goal(s): Improvement in symptoms so as ready for discharge   Short Term Goals: Ability to identify changes in lifestyle to reduce recurrence of condition will improve Ability to verbalize feelings will improve Ability to disclose and discuss suicidal ideas Ability to demonstrate self-control will improve Ability to identify and develop effective coping behaviors will improve Ability to maintain clinical measurements within normal limits will improve Compliance with prescribed medications will improve Ability to identify  triggers associated with substance abuse/mental health issues will improve  Medication Management: Evaluate patient's response, side effects, and tolerance of medication regimen.  Therapeutic Interventions: 1 to 1 sessions, Unit Group sessions and Medication administration.  Evaluation of Outcomes: Not Met  Physician Treatment Plan for Secondary Diagnosis: Principal Problem:   Alcohol abuse with alcohol-induced psychotic disorder, with hallucinations ([place])  Long Term Goal(s): Improvement in symptoms so as ready for discharge   Short Term Goals: Ability to identify changes in lifestyle to reduce recurrence of condition will improve Ability to verbalize feelings will improve Ability to disclose and discuss suicidal ideas Ability to demonstrate self-control will improve Ability to identify and develop effective coping behaviors will improve Ability to maintain clinical measurements within normal limits will improve Compliance with prescribed medications will improve Ability to identify triggers associated with substance abuse/mental health issues will improve     Medication Management: Evaluate patient's response, side effects, and tolerance of medication regimen.  Therapeutic Interventions: 1 to 1 sessions, Unit Group sessions and Medication administration.  Evaluation of Outcomes: Not Met   RN Treatment Plan for Primary Diagnosis: Alcohol abuse with alcohol-induced psychotic disorder, with hallucinations ([place]) Long Term Goal(s): Knowledge of disease and therapeutic regimen to maintain health will improve  Short Term Goals: Ability to remain free from injury will improve, Ability to verbalize frustration and anger appropriately will improve, Ability to demonstrate self-control, Ability to participate in decision making will improve, Ability to verbalize feelings will improve, Ability to identify and develop effective coping behaviors will improve, and Compliance with prescribed  medications will improve  Medication Management: RN will administer medications as ordered by provider, will assess and evaluate patient's response and provide education to patient for prescribed medication. RN will report any adverse and/or side effects to prescribing provider.  Therapeutic Interventions: 1 on 1 counseling sessions, Psychoeducation, Medication administration, Evaluate responses to treatment, Monitor vital signs and CBGs as ordered, Perform/monitor CIWA, COWS, AIMS and Fall Risk screenings as ordered, Perform wound care treatments as ordered.  Evaluation of Outcomes: Not Met   LCSW Treatment Plan for Primary Diagnosis: Alcohol abuse with alcohol-induced psychotic disorder, with hallucinations ([place]) Long Term Goal(s): Safe transition to appropriate next level of care at discharge, Engage patient in therapeutic group addressing interpersonal concerns.  Short Term Goals: Engage patient in aftercare planning with referrals and resources, Increase social support, Increase ability to appropriately verbalize feelings, Increase emotional regulation, Facilitate acceptance of mental health diagnosis and concerns, Facilitate patient progression through stages of change regarding substance use diagnoses and concerns, Identify triggers associated with mental health/substance abuse issues, and Increase skills for wellness and recovery  Therapeutic Interventions: Assess for all discharge needs, 1 to 1 time with Social worker, Explore available resources and support systems, Assess for adequacy in community support network, Educate family and significant other(s) on suicide prevention, Complete Psychosocial Assessment, Interpersonal group therapy.  Evaluation of Outcomes: Not Met   Progress in Treatment: Attending groups: No. Participating in groups: No. Taking medication as  prescribed: Yes. Toleration medication: Yes. Family/Significant other contact made: No, will contact:  pt declined  permission for SPE Patient understands diagnosis: No. Discussing patient identified problems/goals with staff: Yes. Medical problems stabilized or resolved: Yes. Denies suicidal/homicidal ideation: Yes. Issues/concerns per patient self-inventory: No. Other: None  New problem(s) identified: No, Describe:  None  New Short Term/Long Term Goal(s): Patient to work towards detox, elimination of symptoms of psychosis, medication management for mood stabilization; elimination of SI thoughts; development of comprehensive mental wellness/sobriety plan.   Patient Goals:  "I don't know, they sent me here"  Discharge Plan or Barriers: CSW will assist pt with development of appropriate discharge/aftercare plan.  Reason for Continuation of Hospitalization: Hallucinations Medication stabilization Withdrawal symptoms  Estimated Length of Stay: 1-7 days  Last 3 [place] Suicide Severity Risk Score: Flowsheet Row Admission (Current) from 09/27/2022 in [place] ED from 09/26/2022 in [place] ED from 08/14/2022 in [place] No Risk No Risk No Risk       Last PHQ 2/9 Scores:     No data to display          Scribe for Treatment Team: Hilman Kissling A [place], Latanya Presser 09/28/2022 9:44 AM

## 2022-09-29 MED ORDER — THIAMINE MONONITRATE 100 MG PO TABS
100.0000 mg | ORAL_TABLET | Freq: Every day | ORAL | Status: DC
  Administered 2022-09-29 – 2022-10-01 (×3): 100 mg via ORAL
  Filled 2022-09-29 (×3): qty 1

## 2022-09-29 MED ORDER — RISPERIDONE 0.5 MG PO TBDP
1.0000 mg | ORAL_TABLET | Freq: Two times a day (BID) | ORAL | Status: DC
  Administered 2022-09-29 – 2022-09-30 (×2): 1 mg via ORAL
  Filled 2022-09-29: qty 2

## 2022-09-29 NOTE — Progress Notes (Signed)
Patient is alert and oriented times 4. Mood and affect appropriate. Patient rates pain as 5/10. He denies SI, HI, and AVH. Patient also denies any feelings of anxiety and depression at this time. Patient states he slept ok last night. Evening medicines administered whole by mouth without difficulty. Patient ate snack in day room; appetite was fair. Patient remains on unit with Q15 minute checks in place.

## 2022-09-29 NOTE — Progress Notes (Signed)
[facility] MD Progress Note  09/29/2022 3:37 PM Corey Cortez  MRN:  [id number]  Subjective:    Fine, can get double portion of meal? Chart reviewed , discussed with the nursing staff, and patient seen. Per nursing- Patient is alert and oriented times 4. Mood and affect appropriate. Patient rates pain as 9/10. He denies SI, HI, and AVH. Patient does endorse feelings of anxiety and depression at this time. Patient states he slept well last night. Evening medicines administered whole by mouth without difficulty. Patient ate snack in day room; appetite was fair. Patient remains on unit with Q15 minute checks in place .  Patient denies any complaints and wants to be released, states that he had roaches coming out of his ears wants to sue his landlord, appears paranoid disorganized thought process.   Denies depression and/or anxiety. Denies side effects from medications. Denies suicidal ideation, denies homicidal ideation, and denies auditory and/or visual hallucinations. Reports no cravings for alcohol stating he only drinks occasionally and "do not have a problem."   Principal Problem: Alcohol abuse with alcohol-induced psychotic disorder, with hallucinations (HCC) Diagnosis: Principal Problem:   Alcohol abuse with alcohol-induced psychotic disorder, with hallucinations (HCC)  Total Time spent with patient: 30 minutes  Past Psychiatric History: substance abuse  Past Medical History:  Past Medical History:  Diagnosis Date   Arthritis    Ascites    AVM (arteriovenous malformation)    BRAIN   COPD (chronic obstructive pulmonary disease) (HCC)    Dyspnea    WITH EXERTION   Foot drop, right foot    FROM PRIOR BRAIN SURGERY   GERD (gastroesophageal reflux disease)    Hypertension    Occasional tremors    FROM BRAIN SURGERY   Seizures (HCC)    NO RECENT SEIZURES   Suprapubic catheter (HCC)     Past Surgical History:  Procedure Laterality Date   BRAIN SURGERY     BRAIN SURGERY Right     HERNIA REPAIR     LEG SURGERY  1990'S   BULLETT REMOVED FROM LEG   SUPRAPUBIC CATHETER INSERTION     Family History:  Family History  Family history unknown: Yes   Family Psychiatric  History: none Social History:  Social History   Substance and Sexual Activity  Alcohol Use Yes   Alcohol/week: 4.0 standard drinks of alcohol   Types: 4 Shots of liquor per week   Comment: OCC      Social History   Substance and Sexual Activity  Drug Use Yes   Types: Marijuana   Comment: OCC    Social History   Socioeconomic History   Marital status: Divorced    Spouse name: Not on file   Number of children: Not on file   Years of education: Not on file   Highest education level: Not on file  Occupational History   Not on file  Tobacco Use   Smoking status: Every Day    Packs/day: 0.50    Years: 40.00    Total pack years: 20.00    Types: Cigarettes   Smokeless tobacco: Never  Vaping Use   Vaping Use: Never used  Substance and Sexual Activity   Alcohol use: Yes    Alcohol/week: 4.0 standard drinks of alcohol    Types: 4 Shots of liquor per week    Comment: OCC    Drug use: Yes    Types: Marijuana    Comment: OCC   Sexual activity: Yes  Other Topics Concern  Not on file  Social History Narrative   Not on file   Social Determinants of Health   Financial Resource Strain: Not on file  Food Insecurity: Food Insecurity Present (09/27/2022)   Hunger Vital Sign    Worried About Running Out of Food in the Last Year: Sometimes true    Ran Out of Food in the Last Year: Sometimes true  Transportation Needs: No Transportation Needs (09/27/2022)   PRAPARE - Administrator, Civil ServiceTransportation    Lack of Transportation (Medical): No    Lack of Transportation (Non-Medical): No  Physical Activity: Not on file  Stress: Not on file  Social Connections: Not on file   Additional Social History:                         Sleep: Good  Appetite:  Good  Current Medications: Current  Facility-Administered Medications  Medication Dose Route Frequency Provider Last Rate Last Admin   acetaminophen (TYLENOL) tablet 650 mg  650 mg Oral Q6H PRN Gillermo Murdochhompson, Jacqueline, NP   650 mg at 09/29/22 1240   albuterol (VENTOLIN HFA) 108 (90 Base) MCG/ACT inhaler 2 puff  2 puff Inhalation Q6H PRN Orson Aloenderson, William M, RPH   2 puff at 09/29/22 1456   alum & mag hydroxide-simeth (MAALOX/MYLANTA) 200-200-20 MG/5ML suspension 30 mL  30 mL Oral Q4H PRN Gillermo Murdochhompson, Jacqueline, NP       baclofen (LIORESAL) tablet 10 mg  10 mg Oral TID Gillermo Murdochhompson, Jacqueline, NP   10 mg at 09/29/22 1011   HYDROcodone-acetaminophen (NORCO/VICODIN) 5-325 MG per tablet 1 tablet  1 tablet Oral Q12H PRN Charm RingsLord, Jamison Y, NP   1 tablet at 09/28/22 1826   LORazepam (ATIVAN) tablet 1 mg  1 mg Oral Q6H PRN Charm RingsLord, Jamison Y, NP       magnesium hydroxide (MILK OF MAGNESIA) suspension 30 mL  30 mL Oral Daily PRN Gillermo Murdochhompson, Jacqueline, NP       OLANZapine (ZYPREXA) tablet 10 mg  10 mg Oral Daily PRN Charm RingsLord, Jamison Y, NP       Or   OLANZapine (ZYPREXA) injection 10 mg  10 mg Intramuscular Daily PRN Charm RingsLord, Jamison Y, NP       risperiDONE (RISPERDAL M-TABS) disintegrating tablet 1 mg  1 mg Oral BID Charm RingsLord, Jamison Y, NP   1 mg at 09/29/22 1012    Lab Results:  No results found for this or any previous visit (from the past 48 hour(s)).   Blood Alcohol level:  Lab Results  Component Value Date   ETH 85 (H) 09/26/2022   ETH <10 09/10/2019    Metabolic Disorder Labs: Lab Results  Component Value Date   HGBA1C 5.2 09/26/2022   MPG 103 09/26/2022   No results found for: "PROLACTIN" Lab Results  Component Value Date   CHOL 168 09/26/2022   TRIG 171 (H) 09/26/2022   HDL 55 09/26/2022   CHOLHDL 3.1 09/26/2022   VLDL 34 09/26/2022   LDLCALC 79 09/26/2022    Physical Findings: AIMS:  , ,  ,  ,    CIWA:  CIWA-Ar Total: 3 COWS:     Musculoskeletal: Strength & Muscle Tone: within normal limits Gait & Station: normal Patient  leans: N/A  Psychiatric Specialty Exam: Physical Exam Vitals and nursing note reviewed.  Constitutional:      Appearance: Normal appearance.  HENT:     Head: Normocephalic.     Nose: Nose normal.  Pulmonary:     Effort: Pulmonary effort is  normal.  Musculoskeletal:        General: Normal range of motion.     Cervical back: Normal range of motion.  Neurological:     General: No focal deficit present.     Mental Status: He is alert and oriented to person, place, and time.  Psychiatric:        Attention and Perception: Attention and perception normal.        Mood and Affect: Mood is elated.        Speech: Speech normal.        Behavior: Behavior normal. Behavior is cooperative.        Thought Content: Thought content is delusional.        Cognition and Memory: Cognition and memory normal.        Judgment: Judgment is inappropriate.     Review of Systems  Psychiatric/Behavioral:  The patient is nervous/anxious.   All other systems reviewed and are negative.   Blood pressure 124/72, pulse 81, temperature 98.8 F (37.1 C), temperature source Oral, resp. rate 18, height 5\' 9"  (1.753 m), weight 75.5 kg, SpO2 100 %.Body mass index is 24.59 kg/m.  General Appearance: Casual  Eye Contact:  Good  Speech:  Normal Rate  Volume:  Normal  Mood:  Anxious, euphoric  Affect:  Congruent  Thought Process: Concrete and tangential  Orientation:  Full (Time, Place, and Person)  Thought Content:  Delusions as above  Suicidal Thoughts:  denies  Homicidal Thoughts:  No  Memory:  Immediate;   Fair Recent;   Fair Remote;   Fair  Judgement:  Fair  Insight:  Lacking  Psychomotor Activity:  Increased  Concentration:  Concentration: Fair and Attention Span: Fair  Recall:  of Knowledge:  Fair  Language:  Good  Akathisia:  No  Handed:  Right  AIMS (if indicated):     Assets:  Leisure Time Physical Health Resilience  ADL's:  Intact  Cognition:  WNL  Sleep:         Physical  Exam: Physical Exam Vitals and nursing note reviewed.  Constitutional:      Appearance: Normal appearance.  HENT:     Head: Normocephalic.     Nose: Nose normal.  Pulmonary:     Effort: Pulmonary effort is normal.  Musculoskeletal:        General: Normal range of motion.     Cervical back: Normal range of motion.  Neurological:     General: No focal deficit present.     Mental Status: He is alert and oriented to person, place, and time.  Psychiatric:        Attention and Perception: Attention and perception normal.        Mood and Affect: Mood is elated.        Speech: Speech normal.        Behavior: Behavior normal. Behavior is cooperative.        Thought Content: Thought content is delusional.        Cognition and Memory: Cognition and memory normal.        Judgment: Judgment is inappropriate.    Review of Systems  Psychiatric/Behavioral:  The patient is nervous/anxious.   All other systems reviewed and are negative.  Blood pressure 124/72, pulse 81, temperature 98.8 F (37.1 C), temperature source Oral, resp. rate 18, height 5\' 9"  (1.753 m), weight 75.5 kg, SpO2 100 %. Body mass index is 24.59 kg/m.   Treatment Plan Summary: Daily contact with  patient to assess and evaluate symptoms and progress in treatment, Medication management, and Plan : Alcohol level at presentation 85, liver enzymes elevated will monitor,  Alcohol abuse with alcohol induced psychosis with hallucinations: CIWA started with PRN Ativan in place for any withdrawal symptoms Thiamine for supplement.  Paranoia: Titrate Risperdal 1 mg BID, QTc 424 TSH is 1.144.  Beverly Sessions, MD 09/29/2022, 3:37 PMPatient ID: Corey Cortez, male   DOB: [birth year], 56 y.o.   MRN: [id number]

## 2022-09-29 NOTE — Progress Notes (Signed)
This RN thought that the patient's IVC status has expired, so the provider Celso Amy) was called by this RN to renew the IVC.  This RN informed by the provider that the IVC status is good for 7 days.  The patient's IVC was originally signed on 09/26/22, so it should be good until 10/03/22.

## 2022-09-29 NOTE — Progress Notes (Signed)
Patient is alert and oriented times 4. Mood and affect appropriate. Patient rates pain as 9/10. He denies SI, HI, and AVH. Patient does endorse feelings of anxiety and depression at this time. Patient states he slept well last night. Evening medicines administered whole by mouth without difficulty. Patient ate snack in day room; appetite was fair. Patient remains on unit with Q15 minute checks in place.

## 2022-09-29 NOTE — Group Note (Signed)
LCSW Group Therapy Note  Group Date: 09/29/2022 Start Time: 1400 End Time: 1500   Type of Therapy and Topic:  Group Therapy - Healthy vs Unhealthy Coping Skills  Participation Level:  Active   Description of Group The focus of this group was to determine what unhealthy coping techniques typically are used by group members and what healthy coping techniques would be helpful in coping with various problems. Patients were guided in becoming aware of the differences between healthy and unhealthy coping techniques. Patients were asked to identify 2-3 healthy coping skills they would like to learn to use more effectively.  Therapeutic Goals Patients learned that coping is what human beings do all day long to deal with various situations in their lives Patients defined and discussed healthy vs unhealthy coping techniques Patients identified their preferred coping techniques and identified whether these were healthy or unhealthy Patients determined 2-3 healthy coping skills they would like to become more familiar with and use more often. Patients provided support and ideas to each other   Summary of Patient Progress:  During group, Patient expressed that church and his minister are supportive people and places. Patient proved open to input from peers and feedback from CSW. Patient demonstrated good insight into the subject matter, was respectful of peers, and participated throughout the entire session.   Therapeutic Modalities Cognitive Behavioral Therapy Motivational Interviewing  Marinda Elk, [place] 09/29/2022  3:30 PM

## 2022-09-30 MED ORDER — RISPERIDONE 1 MG PO TBDP
2.0000 mg | ORAL_TABLET | Freq: Two times a day (BID) | ORAL | Status: DC
  Administered 2022-09-30: 2 mg via ORAL
  Filled 2022-09-30 (×2): qty 2

## 2022-09-30 NOTE — Progress Notes (Signed)
Patient alert and oriented x 4.  Patient with animated affect.  Patient denies SI,HI and AVH.  Patient also denies feelings of anxiety or depression.  Denies pain at this time.  Patient is compliant with schedule medications.  PRN inhaler given as ordered (per patient request).  15 min checks in place. Patient contracts for safety on the unit.  Patient is present throughout the day in the milieu.  Patient  continues to makes inappropriate comments to male staff and dominates conversations in the dayroom with peers. Patient asked this writer if he would be able to discharge tomorrow.  RN explained this decision would be made by the psychiatrist.  Patient accepting.

## 2022-09-30 NOTE — Progress Notes (Signed)
[facility] MD Progress Note  09/30/2022 1:55 PM Demariae Panagopoulos  MRN:  [id number]  Subjective:    Leg pain/spasm" Chart reviewed , discussed with the nursing staff, and patient seen. Per nursing- pt flirting and verbally inappropriate with male staff. Patient is verbally inappropriate with this Probation officer. Pt is talking about wanting to get married and having a house together. Pt said: "I need to give you at least one baby so everyone knows I love you." Pt also said "you are so sexy, stunning, and beautiful". RN aware.    On assessment- pt reports leg spssm and pain and he has been referred to podiatry. Pt on Norco and baclofen. Patient denies any other complaints and wants to be released, states that roaches still coming out of his ears wants to sue his landlord, appears paranoid disorganized thought process.  Denies depression and/or anxiety. Denies side effects from medications. Denies suicidal ideation, denies homicidal ideation, and denies auditory and/or visual hallucinations. Reports no cravings for alcohol stating he only drinks occasionally and "do not have a problem."   Principal Problem: Alcohol abuse with alcohol-induced psychotic disorder, with hallucinations ([place]) Diagnosis: Principal Problem:   Alcohol abuse with alcohol-induced psychotic disorder, with hallucinations ([place])  Total Time spent with patient: 30 minutes  Past Psychiatric History: substance abuse  Past Medical History:  Past Medical History:  Diagnosis Date   Arthritis    Ascites    AVM (arteriovenous malformation)    BRAIN   COPD (chronic obstructive pulmonary disease) (HCC)    Dyspnea    WITH EXERTION   Foot drop, right foot    FROM PRIOR BRAIN SURGERY   GERD (gastroesophageal reflux disease)    Hypertension    Occasional tremors    FROM BRAIN SURGERY   Seizures ([place])    NO RECENT SEIZURES   Suprapubic catheter ([place])     Past Surgical History:  Procedure Laterality Date   BRAIN SURGERY     BRAIN  SURGERY Right    HERNIA REPAIR     LEG SURGERY  1990'S   BULLETT REMOVED FROM LEG   SUPRAPUBIC CATHETER INSERTION     Family History:  Family History  Family history unknown: Yes   Family Psychiatric  History: none Social History:  Social History   Substance and Sexual Activity  Alcohol Use Yes   Alcohol/week: 4.0 standard drinks of alcohol   Types: 4 Shots of liquor per week   Comment: OCC      Social History   Substance and Sexual Activity  Drug Use Yes   Types: Marijuana   Comment: OCC    Social History   Socioeconomic History   Marital status: Divorced    Spouse name: Not on file   Number of children: Not on file   Years of education: Not on file   Highest education level: Not on file  Occupational History   Not on file  Tobacco Use   Smoking status: Every Day    Packs/day: 0.50    Years: 40.00    Total pack years: 20.00    Types: Cigarettes   Smokeless tobacco: Never  Vaping Use   Vaping Use: Never used  Substance and Sexual Activity   Alcohol use: Yes    Alcohol/week: 4.0 standard drinks of alcohol    Types: 4 Shots of liquor per week    Comment: OCC    Drug use: Yes    Types: Marijuana    Comment: OCC   Sexual activity: Yes  Other Topics Concern   Not on file  Social History Narrative   Not on file   Social Determinants of Health   Financial Resource Strain: Not on file  Food Insecurity: Food Insecurity Present (09/27/2022)   Hunger Vital Sign    Worried About Running Out of Food in the Last Year: Sometimes true    Ran Out of Food in the Last Year: Sometimes true  Transportation Needs: No Transportation Needs (09/27/2022)   PRAPARE - Hydrologist (Medical): No    Lack of Transportation (Non-Medical): No  Physical Activity: Not on file  Stress: Not on file  Social Connections: Not on file   Additional Social History:                         Sleep: Good  Appetite:  Good  Current  Medications: Current Facility-Administered Medications  Medication Dose Route Frequency Provider Last Rate Last Admin   acetaminophen (TYLENOL) tablet 650 mg  650 mg Oral Q6H PRN Caroline Sauger, NP   650 mg at 09/29/22 2107   albuterol (VENTOLIN HFA) 108 (90 Base) MCG/ACT inhaler 2 puff  2 puff Inhalation Q6H PRN Delena Bali, RPH   2 puff at 09/30/22 0934   alum & mag hydroxide-simeth (MAALOX/MYLANTA) 200-200-20 MG/5ML suspension 30 mL  30 mL Oral Q4H PRN Caroline Sauger, NP       baclofen (LIORESAL) tablet 10 mg  10 mg Oral TID Caroline Sauger, NP   10 mg at 09/30/22 [id number]   HYDROcodone-acetaminophen (NORCO/VICODIN) 5-325 MG per tablet 1 tablet  1 tablet Oral Q12H PRN Patrecia Pour, NP   1 tablet at 09/28/22 1826   LORazepam (ATIVAN) tablet 1 mg  1 mg Oral Q6H PRN Patrecia Pour, NP       magnesium hydroxide (MILK OF MAGNESIA) suspension 30 mL  30 mL Oral Daily PRN Caroline Sauger, NP       OLANZapine (ZYPREXA) tablet 10 mg  10 mg Oral Daily PRN Patrecia Pour, NP       Or   OLANZapine (ZYPREXA) injection 10 mg  10 mg Intramuscular Daily PRN Patrecia Pour, NP       risperiDONE (RISPERDAL M-TABS) disintegrating tablet 1 mg  1 mg Oral BID Lorin Picket, RPH   1 mg at 09/30/22 [id number]   thiamine (VITAMIN B1) tablet 100 mg  100 mg Oral Daily Lenward Chancellor, MD   100 mg at 09/30/22 [id number]    Lab Results:  No results found for this or any previous visit (from the past 48 hour(s)).   Blood Alcohol level:  Lab Results  Component Value Date   ETH 85 (H) 09/26/2022   ETH <10 [id number]    Metabolic Disorder Labs: Lab Results  Component Value Date   HGBA1C 5.2 09/26/2022   MPG 103 09/26/2022   No results found for: "PROLACTIN" Lab Results  Component Value Date   CHOL 168 09/26/2022   TRIG 171 (H) 09/26/2022   HDL 55 09/26/2022   CHOLHDL 3.1 09/26/2022   VLDL 34 09/26/2022   LDLCALC 79 09/26/2022    Physical Findings: AIMS:  , ,  ,  ,    CIWA:   CIWA-Ar Total: 3 COWS:     Musculoskeletal: Strength & Muscle Tone: within normal limits Gait & Station: normal Patient leans: N/A  Psychiatric Specialty Exam: Physical Exam Vitals and nursing note reviewed.  Constitutional:  Appearance: Normal appearance.  HENT:     Head: Normocephalic.     Nose: Nose normal.  Pulmonary:     Effort: Pulmonary effort is normal.  Musculoskeletal:        General: Normal range of motion.     Cervical back: Normal range of motion.  Neurological:     General: No focal deficit present.     Mental Status: He is alert and oriented to person, place, and time.  Psychiatric:        Attention and Perception: Attention and perception normal.        Mood and Affect: Mood is elated.        Speech: Speech normal.        Behavior: Behavior normal. Behavior is cooperative.        Thought Content: Thought content is delusional.        Cognition and Memory: Cognition and memory normal.        Judgment: Judgment is inappropriate.     Review of Systems  Psychiatric/Behavioral:  The patient is nervous/anxious.   All other systems reviewed and are negative.   Blood pressure 107/65, pulse 72, temperature 97.8 F (36.6 C), temperature source Oral, resp. rate 18, height 5\' 9"  (1.753 m), weight 75.5 kg, SpO2 100 %.Body mass index is 24.59 kg/m.  General Appearance: Casual  Eye Contact:  Good  Speech:  Normal Rate  Volume:  Normal  Mood:  Anxious, euphoric  Affect:  Congruent  Thought Process: Concrete and tangential  Orientation:  Full (Time, Place, and Person)  Thought Content:  Delusions as above, somatic delusion and tactile hallucination  Suicidal Thoughts:  denies  Homicidal Thoughts:  No  Memory:  Immediate;   Fair Recent;   Fair Remote;   Fair  Judgement:  Fair  Insight:  Lacking  Psychomotor Activity:  Increased  Concentration:  Concentration: Fair and Attention Span: Fair  Recall:  of Knowledge:  Fair  Language:  Good   Akathisia:  No  Handed:  Right  AIMS (if indicated):     Assets:  Leisure Time Physical Health Resilience  ADL's:  Intact  Cognition:  WNL  Sleep:         Physical Exam: Physical Exam Vitals and nursing note reviewed.  Constitutional:      Appearance: Normal appearance.  HENT:     Head: Normocephalic.     Nose: Nose normal.  Pulmonary:     Effort: Pulmonary effort is normal.  Musculoskeletal:        General: Normal range of motion.     Cervical back: Normal range of motion.  Neurological:     General: No focal deficit present.     Mental Status: He is alert and oriented to person, place, and time.  Psychiatric:        Attention and Perception: Attention and perception normal.        Mood and Affect: Mood is elated.        Speech: Speech normal.        Behavior: Behavior normal. Behavior is cooperative.        Thought Content: Thought content is delusional.        Cognition and Memory: Cognition and memory normal.        Judgment: Judgment is inappropriate.    Review of Systems  Psychiatric/Behavioral:  The patient is nervous/anxious.   All other systems reviewed and are negative.  Blood pressure 107/65, pulse 72, temperature 97.8 F (  36.6 C), temperature source Oral, resp. rate 18, height 5\' 9"  (1.753 m), weight 75.5 kg, SpO2 100 %. Body mass index is 24.59 kg/m.   Treatment Plan Summary: Daily contact with patient to assess and evaluate symptoms and progress in treatment, Medication management, and Plan : Alcohol level at presentation 85, liver enzymes elevated will monitor, TSH is 1.144. Alcohol abuse with alcohol induced psychosis with hallucinations: CIWA started with PRN Ativan in place for any withdrawal symptoms Thiamine for supplement.  Paranoia: Increase Risperdal 2 mg BID, QTc 424 Norco and baclofen, tylenol for  leg pain/spasm    Lenward Chancellor, MD 09/30/2022, 1:55 PMPatient ID: Marjorie Smolder, male   DOB: 1965-11-21, 56 y.o.   MRN:  [id number] Patient ID: Sven Tribe, male   DOB: [birth year], 56 y.o.   MRN: [id number]

## 2022-09-30 NOTE — Plan of Care (Signed)
  Problem: Education: Goal: Emotional status will improve Outcome: Progressing Goal: Mental status will improve Outcome: Progressing Goal: Verbalization of understanding the information provided will improve Outcome: Progressing   Problem: Activity: Goal: Interest or engagement in activities will improve Outcome: Progressing   Problem: Safety: Goal: Periods of time without injury will increase Outcome: Progressing   Problem: Coping: Goal: Ability to demonstrate self-control will improve Outcome: Not Progressing

## 2022-09-30 NOTE — Progress Notes (Signed)
Patient attended music therapy group. Pt actively engaged in discussion on how music can be used as a Associate Professor. Pt identified "A tree is known by the fruit it bear" because "This is an inspirational song to be when I feel depressed and like I am not good enough for my family."

## 2022-09-30 NOTE — Progress Notes (Signed)
Patient is verbally inappropriate with this Clinical research associate. Pt is talking about wanting to get married and having a house together. Pt said: "I need to give you at least one baby so everyone knows I love you." Pt also said "you are so sexy, stunning, and beautiful". RN aware.

## 2022-10-01 MED ORDER — RISPERIDONE 1 MG PO TABS
2.0000 mg | ORAL_TABLET | Freq: Two times a day (BID) | ORAL | Status: DC
  Administered 2022-10-01: 2 mg via ORAL
  Filled 2022-10-01: qty 2

## 2022-10-01 MED ORDER — RISPERIDONE 2 MG PO TABS: 2.0000 mg | ORAL_TABLET | Freq: Two times a day (BID) | ORAL | 3 refills | Status: AC

## 2022-10-01 NOTE — Plan of Care (Signed)
  Problem: Education: Goal: Knowledge of General Education information will improve Description: Including pain rating scale, medication(s)/side effects and non-pharmacologic comfort measures Outcome: Progressing   Problem: Health Behavior/Discharge Planning: Goal: Ability to manage health-related needs will improve Outcome: Progressing   Problem: Clinical Measurements: Goal: Ability to maintain clinical measurements within normal limits will improve Outcome: Progressing Goal: Will remain free from infection Outcome: Progressing Goal: Diagnostic test results will improve Outcome: Progressing Goal: Respiratory complications will improve Outcome: Progressing Goal: Cardiovascular complication will be avoided Outcome: Progressing   Problem: Nutrition: Goal: Adequate nutrition will be maintained Outcome: Progressing   Problem: Coping: Goal: Level of anxiety will decrease Outcome: Progressing   Problem: Elimination: Goal: Will not experience complications related to bowel motility Outcome: Progressing Goal: Will not experience complications related to urinary retention Outcome: Progressing   Problem: Safety: Goal: Ability to remain free from injury will improve Outcome: Progressing   Problem: Skin Integrity: Goal: Risk for impaired skin integrity will decrease Outcome: Progressing   Problem: Education: Goal: Knowledge of [place] General Education information/materials will improve Outcome: Progressing Goal: Emotional status will improve Outcome: Progressing Goal: Mental status will improve Outcome: Progressing Goal: Verbalization of understanding the information provided will improve Outcome: Progressing   Problem: Safety: Goal: Periods of time without injury will increase Outcome: Progressing   Problem: Physical Regulation: Goal: Ability to maintain clinical measurements within normal limits will improve Outcome: Progressing   Problem: Health  Behavior/Discharge Planning: Goal: Identification of resources available to assist in meeting health care needs will improve Outcome: Progressing   Problem: Health Behavior/Discharge Planning: Goal: Compliance with treatment plan for underlying cause of condition will improve Outcome: Progressing

## 2022-10-01 NOTE — Progress Notes (Signed)
Patient is alert and oriented times 4. Mood and affect appropriate. Patient rates pain as 9/10 to right side of head. He denies SI, HI, and AVH. Also denies feelings of anxiety and depression at this time. States he slept good last night. Morning meds given whole by mouth W/O difficulty. Ate breakfast in day room- appetite good. Patient remains on unit with Q15 minute checks in place.

## 2022-10-01 NOTE — Plan of Care (Signed)
Problem: Education: Goal: Knowledge of General Education information will improve Description: Including pain rating scale, medication(s)/side effects and non-pharmacologic comfort measures 10/01/2022 1502 by Doneen Poisson, RN Outcome: Adequate for Discharge 10/01/2022 1335 by Doneen Poisson, RN Outcome: Progressing   Problem: Health Behavior/Discharge Planning: Goal: Ability to manage health-related needs will improve 10/01/2022 1502 by Doneen Poisson, RN Outcome: Adequate for Discharge 10/01/2022 1335 by Doneen Poisson, RN Outcome: Progressing   Problem: Clinical Measurements: Goal: Ability to maintain clinical measurements within normal limits will improve 10/01/2022 1502 by Doneen Poisson, RN Outcome: Adequate for Discharge 10/01/2022 1335 by Doneen Poisson, RN Outcome: Progressing Goal: Will remain free from infection 10/01/2022 1502 by Doneen Poisson, RN Outcome: Adequate for Discharge 10/01/2022 1335 by Doneen Poisson, RN Outcome: Progressing Goal: Diagnostic test results will improve 10/01/2022 1502 by Doneen Poisson, RN Outcome: Adequate for Discharge 10/01/2022 1335 by Doneen Poisson, RN Outcome: Progressing Goal: Respiratory complications will improve 10/01/2022 1502 by Doneen Poisson, RN Outcome: Adequate for Discharge 10/01/2022 1335 by Doneen Poisson, RN Outcome: Progressing Goal: Cardiovascular complication will be avoided 10/01/2022 1502 by Doneen Poisson, RN Outcome: Adequate for Discharge 10/01/2022 1335 by Doneen Poisson, RN Outcome: Progressing   Problem: Nutrition: Goal: Adequate nutrition will be maintained 10/01/2022 1502 by Doneen Poisson, RN Outcome: Adequate for Discharge 10/01/2022 1335 by Doneen Poisson, RN Outcome: Progressing   Problem: Activity: Goal: Risk for activity intolerance will decrease 10/01/2022 1502 by Doneen Poisson, RN Outcome: Adequate for  Discharge 10/01/2022 1335 by Doneen Poisson, RN Outcome: Progressing   Problem: Coping: Goal: Level of anxiety will decrease 10/01/2022 1502 by Doneen Poisson, RN Outcome: Adequate for Discharge 10/01/2022 1335 by Doneen Poisson, RN Outcome: Progressing   Problem: Elimination: Goal: Will not experience complications related to bowel motility 10/01/2022 1502 by Doneen Poisson, RN Outcome: Adequate for Discharge 10/01/2022 1335 by Doneen Poisson, RN Outcome: Progressing Goal: Will not experience complications related to urinary retention 10/01/2022 1502 by Doneen Poisson, RN Outcome: Adequate for Discharge 10/01/2022 1335 by Doneen Poisson, RN Outcome: Progressing   Problem: Skin Integrity: Goal: Risk for impaired skin integrity will decrease 10/01/2022 1502 by Doneen Poisson, RN Outcome: Adequate for Discharge 10/01/2022 1335 by Doneen Poisson, RN Outcome: Progressing   Problem: Safety: Goal: Ability to remain free from injury will improve 10/01/2022 1502 by Doneen Poisson, RN Outcome: Adequate for Discharge 10/01/2022 1335 by Doneen Poisson, RN Outcome: Progressing   Problem: Education: Goal: Knowledge of [place] General Education information/materials will improve 10/01/2022 1502 by Doneen Poisson, RN Outcome: Adequate for Discharge 10/01/2022 1335 by Doneen Poisson, RN Outcome: Progressing Goal: Emotional status will improve 10/01/2022 1502 by Doneen Poisson, RN Outcome: Adequate for Discharge 10/01/2022 1335 by Doneen Poisson, RN Outcome: Progressing Goal: Mental status will improve 10/01/2022 1502 by Doneen Poisson, RN Outcome: Adequate for Discharge 10/01/2022 1335 by Doneen Poisson, RN Outcome: Progressing Goal: Verbalization of understanding the information provided will improve 10/01/2022 1502 by Doneen Poisson, RN Outcome: Adequate for Discharge 10/01/2022 1335 by Doneen Poisson, RN Outcome: Progressing   Problem: Physical Regulation: Goal: Ability to maintain clinical measurements within normal limits will improve 10/01/2022 1502 by Doneen Poisson, RN Outcome: Adequate for Discharge 10/01/2022 1335 by Doneen Poisson, RN Outcome: Progressing   Problem: Safety: Goal: Periods of time without injury will increase 10/01/2022 1502 by Mitzie Na,  Jodelle Gross, RN Outcome: Adequate for Discharge 10/01/2022 1335 by Doneen Poisson, RN Outcome: Progressing

## 2022-10-01 NOTE — Progress Notes (Signed)
Patient is alert and oriented times 4. Mood and affect appropriate. Patient rates pain as 4/10. He denies SI, HI, and AVH. Patient also denies feelings of anxiety and depression at this time. Patient states he slept well last night. Evening medicines administered whole by mouth without difficulty. Patient ate snack in day room; appetite was fair. Patient remains on unit with Q15 minute checks in place.

## 2022-10-01 NOTE — BHH Counselor (Signed)
Pt requested to talk with CSW and he stated that his transportation fell through because his cousin "has to take her child to a doctor's appointment." CSW asked if pt would be interested in bus pass or transportation assistance with safe transport. Pt stated that he would be interested in bus pass. CSW will assist pt with obtaining bus pass.   Merick Kelleher [place], MSW, LCSW-A 11/27/20231:39 PM

## 2022-10-01 NOTE — Progress Notes (Signed)
  [facility] Adult Case Management Discharge Plan :  Will you be returning to the same living situation after discharge:  No. At discharge, do you have transportation home?: Yes,  CSW will assist pt with obtaining public transportation Do you have the ability to pay for your medications: Yes,  pt has [facility] Medicare  Release of information consent forms completed and in the chart;  Patient's signature needed at discharge.  Patient to Follow up at:  Follow-up Optometrist at [facility] Follow up.   Specialty: Family Medicine Why: Though you declined follow up services, here is a resource that you can reach out to if needed. Contact information: [address] [place] [address] [phone]        [facility] Follow up.   Why: Though you declined follow up services, here is a resource you can reach out to if needed. Contact information: [address] [place] [address] [phone]                 Next level of care provider has access to [facility] Link:no  Safety Planning and Suicide Prevention discussed: Yes,  SPE completed with pt     Has patient been referred to the Quitline?: Patient refused referral  Patient has been referred for addiction treatment: Pt. refused referral  Josphine Laffey A [place], LCSWA 10/01/2022, 2:57 PM

## 2022-10-01 NOTE — Progress Notes (Signed)

## 2022-10-01 NOTE — BHH Counselor (Signed)
CSW spoke with pt regarding follow up care. Pt stated that he was interested in referral for psychiatry. However he said that he wanted his PCP to provide referral. CSW will provide pt with referral resources as well.   Gennifer Potenza [place], MSW, LCSW-A 11/27/20231:32 PM

## 2022-10-01 NOTE — B[facility] Suicide Risk Assessment (Signed)
[facility] Discharge Suicide Risk Assessment   Principal Problem: Alcohol abuse with alcohol-induced psychotic disorder, with hallucinations (HCC) Discharge Diagnoses: Principal Problem:   Alcohol abuse with alcohol-induced psychotic disorder, with hallucinations (HCC)   Total Time spent with patient: 15 minutes  Musculoskeletal: Strength & Muscle Tone: within normal limits Gait & Station: normal Patient leans: N/A  Psychiatric Specialty Exam  Presentation  General Appearance:  Appropriate for Environment  Eye Contact: Good  Speech: Pressured  Speech Volume: Increased  Handedness: Right   Mood and Affect  Mood: Angry; Irritable  Duration of Depression Symptoms: No data recorded Affect: Full Range   Thought Process  Thought Processes: Coherent  Descriptions of Associations:Loose  Orientation:Full (Time, Place and Person)  Thought Content:Tangential; Illogical; Rumination  History of Schizophrenia/Schizoaffective disorder:No  Duration of Psychotic Symptoms:Less than six months  Hallucinations:No data recorded Ideas of Reference:Delusions; Paranoia  Suicidal Thoughts:No data recorded Homicidal Thoughts:No data recorded  Sensorium  Memory: Immediate Poor; Recent Poor; Remote Poor  Judgment: Poor  Insight: Poor   Executive Functions  Concentration: Fair  Attention Span: Fair  Recall: Fair  Fund of Knowledge: Poor  Language: Fair   Psychomotor Activity  Psychomotor Activity:No data recorded  Assets  Assets: Communication Skills; Desire for Improvement; Housing; Leisure Time; Resilience; Social Support; Vocational/Educational   Sleep  Sleep:No data recorded   Blood pressure (!) 148/115, pulse 75, temperature 97.7 F (36.5 C), temperature source Oral, resp. rate 20, height 5\' 9"  (1.753 m), weight 75.5 kg, SpO2 100 %. Body mass index is 24.59 kg/m.  Mental Status Per Nursing Assessment::   On Admission:  Thoughts of violence  towards others  Demographic Factors:  Male  Loss Factors: NA  Historical Factors: Impulsivity  Risk Reduction Factors:   NA  Continued Clinical Symptoms:  Alcohol/Substance Abuse/Dependencies  Cognitive Features That Contribute To Risk:  None    Suicide Risk:  Minimal: No identifiable suicidal ideation.  Patients presenting with no risk factors but with morbid ruminations; may be classified as minimal risk based on the severity of the depressive symptoms    Plan Of Care/Follow-up recommendations: [facility]   [phone], DO 10/01/2022, 11:09 AM

## 2022-10-01 NOTE — Discharge Summary (Signed)
Physician Discharge Summary Note  Patient:  Corey Cortez is an 56 y.o., male MRN:  [id number] DOB:  [birth year] Patient phone:  [phone] (home)  Patient address:   [place] [address],  Total Time spent with patient: 1 hour  Date of Admission:  09/27/2022 Date of Discharge: 10/01/2022  Reason for Admission:  Patient is a 56 year old male presenting to [facility] ED under IVC. Per triage note Pt brought in by [employer] handcuffed.  Pt is IVC.  Pt reports that he bust the windows out of a boarding house today with a golf club.  Pt denies SI or HI.  Pt denies drugs.  Pt admits to etoh use today. During assessment patient appears alert and oriented x4, cooperative but irritable. Patient reports "I moved out of a 1 bedroom apartment" then patient goes on a tangent regarding someone in the area "that likes to steal and break into my home, I'm getting ready to move out." Patient reports "I broke out the windows chasing him, he keeps tapping on my door." Patient's BAL is 85. Per IVC paperwork, patient has suffered from a mental breakdown and is under the delusion that  someone is out to get him in the house and possibly kill him. During the end of the assessment patient was focused on discharge and demanding to be sent home, he reports "I have to go put the [place] on." Patient denies SI/HI.   Principal Problem: Alcohol abuse with alcohol-induced psychotic disorder, with hallucinations ([place]) Discharge Diagnoses: Principal Problem:   Alcohol abuse with alcohol-induced psychotic disorder, with hallucinations ([place])   Past Psychiatric History: Unremarkable  Past Medical History:  Past Medical History:  Diagnosis Date   Arthritis    Ascites    AVM (arteriovenous malformation)    BRAIN   COPD (chronic obstructive pulmonary disease) (HCC)    Dyspnea    WITH EXERTION   Foot drop, right foot    FROM PRIOR BRAIN SURGERY   GERD (gastroesophageal reflux disease)    Hypertension     Occasional tremors    FROM BRAIN SURGERY   Seizures ([place])    NO RECENT SEIZURES   Suprapubic catheter ([place])     Past Surgical History:  Procedure Laterality Date   BRAIN SURGERY     BRAIN SURGERY Right    HERNIA REPAIR     LEG SURGERY  1990'S   BULLETT REMOVED FROM LEG   SUPRAPUBIC CATHETER INSERTION     Family History:  Family History  Family history unknown: Yes   Family Psychiatric  History: Unremarkable Social History:  Social History   Substance and Sexual Activity  Alcohol Use Yes   Alcohol/week: 4.0 standard drinks of alcohol   Types: 4 Shots of liquor per week   Comment: OCC      Social History   Substance and Sexual Activity  Drug Use Yes   Types: Marijuana   Comment: OCC    Social History   Socioeconomic History   Marital status: Divorced    Spouse name: Not on file   Number of children: Not on file   Years of education: Not on file   Highest education level: Not on file  Occupational History   Not on file  Tobacco Use   Smoking status: Every Day    Packs/day: 0.50    Years: 40.00    Total pack years: 20.00    Types: Cigarettes   Smokeless tobacco: Never  Vaping Use   Vaping  Use: Never used  Substance and Sexual Activity   Alcohol use: Yes    Alcohol/week: 4.0 standard drinks of alcohol    Types: 4 Shots of liquor per week    Comment: OCC    Drug use: Yes    Types: Marijuana    Comment: OCC   Sexual activity: Yes  Other Topics Concern   Not on file  Social History Narrative   Not on file   Social Determinants of Health   Financial Resource Strain: Not on file  Food Insecurity: Food Insecurity Present (10/01/2022)   Hunger Vital Sign    Worried About Running Out of Food in the Last Year: Sometimes true    Ran Out of Food in the Last Year: Sometimes true  Transportation Needs: No Transportation Needs (09/27/2022)   PRAPARE - Hydrologist (Medical): No    Lack of Transportation (Non-Medical): No   Physical Activity: Not on file  Stress: Not on file  Social Connections: Not on file    Hospital Course: Corey Cortez is a 56 year old African-American male who was involuntarily admitted to inpatient psychiatry for disruptive behavior.  Apparently, he lives in a boardinghouse in the change to his room and rent in he was drinking alcohol and broke the windows.  He was placed on a detox protocol while in the hospital and was successfully detoxed.  He sobered up and asked to leave.  He did not have much of a psychiatric history.  He was not suicidal or homicidal.  He was not psychotic.  His liver enzymes were elevated and his blood pressure was elevated but his other lab work was normal.  He was placed on Resporal 2 mg twice a day and did well with this.  No side effects.  It was felt that he maximized hospitalization and was discharged home.  On the day of discharge she denied suicidal ideation, homicidal ideation, auditory or visual hallucinations.  His judgment and insight were good.  Physical Findings: AIMS:  , ,  ,  ,    CIWA:  CIWA-Ar Total: 3 COWS:     Musculoskeletal: Strength & Muscle Tone: within normal limits Gait & Station: normal Patient leans: N/A   Psychiatric Specialty Exam:  Presentation  General Appearance:  Appropriate for Environment  Eye Contact: Good  Speech: Pressured  Speech Volume: Increased  Handedness: Right   Mood and Affect  Mood: Angry; Irritable  Affect: Full Range   Thought Process  Thought Processes: Coherent  Descriptions of Associations:Loose  Orientation:Full (Time, Place and Person)  Thought Content:Tangential; Illogical; Rumination  History of Schizophrenia/Schizoaffective disorder:No  Duration of Psychotic Symptoms:Less than six months  Hallucinations:No data recorded Ideas of Reference:Delusions; Paranoia  Suicidal Thoughts:No data recorded Homicidal Thoughts:No data recorded  Sensorium  Memory: Immediate Poor;  Recent Poor; Remote Poor  Judgment: Poor  Insight: Poor   Executive Functions  Concentration: Fair  Attention Span: Fair  Recall: [place] of Knowledge: Poor  Language: Fair   Psychomotor Activity  Psychomotor Activity:No data recorded  Assets  Assets: Communication Skills; Desire for Improvement; Housing; Leisure Time; Resilience; Social Support; Vocational/Educational   Sleep  Sleep:No data recorded   Physical Exam: Physical Exam Vitals and nursing note reviewed.  Constitutional:      Appearance: Normal appearance. He is normal weight.  Neurological:     General: No focal deficit present.     Mental Status: He is alert and oriented to person, place, and time.  Psychiatric:  Attention and Perception: Attention and perception normal.        Mood and Affect: Mood and affect normal.        Speech: Speech normal.        Behavior: Behavior normal. Behavior is cooperative.        Thought Content: Thought content normal.        Cognition and Memory: Cognition and memory normal.        Judgment: Judgment normal.    Review of Systems  Constitutional: Negative.   HENT: Negative.    Eyes: Negative.   Respiratory: Negative.    Cardiovascular: Negative.   Gastrointestinal: Negative.   Genitourinary: Negative.   Musculoskeletal: Negative.   Skin: Negative.   Neurological: Negative.   Endo/Heme/Allergies: Negative.   Psychiatric/Behavioral: Negative.     Blood pressure (!) 148/115, pulse 75, temperature 97.7 F (36.5 C), temperature source Oral, resp. rate 20, height 5\' 9"  (1.753 m), weight 75.5 kg, SpO2 100 %. Body mass index is 24.59 kg/m.   Social History   Tobacco Use  Smoking Status Every Day   Packs/day: 0.50   Years: 40.00   Total pack years: 20.00   Types: Cigarettes  Smokeless Tobacco Never   Tobacco Cessation:  A prescription for an FDA-approved tobacco cessation medication was offered at discharge and the patient  refused   Blood Alcohol level:  Lab Results  Component Value Date   ETH 85 (H) 09/26/2022   ETH <10 09/10/2019    Metabolic Disorder Labs:  Lab Results  Component Value Date   HGBA1C 5.2 09/26/2022   MPG 103 09/26/2022   No results found for: "PROLACTIN" Lab Results  Component Value Date   CHOL 168 09/26/2022   TRIG 171 (H) 09/26/2022   HDL 55 09/26/2022   CHOLHDL 3.1 09/26/2022   VLDL 34 09/26/2022   LDLCALC 79 09/26/2022    See Psychiatric Specialty Exam and Suicide Risk Assessment completed by Attending Physician prior to discharge.  Discharge destination:  Home  Is patient on multiple antipsychotic therapies at discharge:  No   Has Patient had three or more failed trials of antipsychotic monotherapy by history:  No  Recommended Plan for Multiple Antipsychotic Therapies: NA   Allergies as of 10/01/2022       Reactions   Advil [ibuprofen] Itching        Medication List     STOP taking these medications    Anoro Ellipta 62.5-25 MCG/ACT Aepb Generic drug: umeclidinium-vilanterol   baclofen 10 MG tablet Commonly known as: LIORESAL   docusate sodium 100 MG capsule Commonly known as: COLACE   HYDROcodone-acetaminophen 5-325 MG tablet Commonly known as: NORCO/VICODIN   multivitamin with minerals tablet   naproxen sodium 220 MG tablet Commonly known as: ALEVE   nitrofurantoin (macrocrystal-monohydrate) 100 MG capsule Commonly known as: MACROBID   pantoprazole 40 MG tablet Commonly known as: PROTONIX   ProAir HFA 108 (90 Base) MCG/ACT inhaler Generic drug: albuterol   tamsulosin 0.4 MG Caps capsule Commonly known as: FLOMAX       TAKE these medications      Indication  risperiDONE 2 MG tablet Commonly known as: RISPERDAL Take 1 tablet (2 mg total) by mouth 2 (two) times daily.  Indication: Agitated Movements Accompanied by Emotional Distress         Follow-up recommendations:  [facility]    Signed: 12-04-1986,  DO 10/01/2022, 11:20 AM

## 2022-10-03 ENCOUNTER — Emergency Department
Admission: EM | Admit: 2022-10-03 | Discharge: 2022-10-03 | Discharge status: Against Medical Advice | Payer: Medicare Other

## 2022-10-03 NOTE — ED Notes (Addendum)
First RN: per EMS pt was picked from court house, had court today, called 911 for sob, when EMS arrived pt reports that he has been sob for 20 years. Pt arrived in lobby yelling "merry christmas, Nadene Rubins claus is coming" and hopped out of wheelchair and seen leaving lobby. States that he is going home.

## 2022-10-26 ENCOUNTER — Emergency Department
Admission: EM | Admit: 2022-10-26 | Discharge: 2022-10-26 | Discharge status: Against Medical Advice | Payer: Medicare Other | Attending: Emergency Medicine | Admitting: Emergency Medicine

## 2022-10-26 ENCOUNTER — Emergency Department
Admission: EM | Admit: 2022-10-26 | Discharge: 2022-10-26 | Disposition: A | Discharge status: Home / Self Care | Payer: Medicare Other | Source: Home / Self Care | Attending: Emergency Medicine | Admitting: Emergency Medicine

## 2022-10-26 ENCOUNTER — Emergency Department: Payer: Medicare Other

## 2022-10-26 ENCOUNTER — Other Ambulatory Visit: Payer: Self-pay

## 2022-10-26 DIAGNOSIS — M2042 Other hammer toe(s) (acquired), left foot: Secondary | ICD-10-CM | POA: Insufficient documentation

## 2022-10-26 DIAGNOSIS — L84 Corns and callosities: Secondary | ICD-10-CM | POA: Insufficient documentation

## 2022-10-26 DIAGNOSIS — I10 Essential (primary) hypertension: Secondary | ICD-10-CM | POA: Insufficient documentation

## 2022-10-26 DIAGNOSIS — M79672 Pain in left foot: Secondary | ICD-10-CM

## 2022-10-26 DIAGNOSIS — G8929 Other chronic pain: Secondary | ICD-10-CM | POA: Insufficient documentation

## 2022-10-26 DIAGNOSIS — J449 Chronic obstructive pulmonary disease, unspecified: Secondary | ICD-10-CM | POA: Insufficient documentation

## 2022-10-26 DIAGNOSIS — Z5329 Procedure and treatment not carried out because of patient's decision for other reasons: Secondary | ICD-10-CM | POA: Diagnosis not present

## 2022-10-26 MED ORDER — ACETAMINOPHEN 500 MG PO TABS: 1000.0000 mg | ORAL_TABLET | Freq: Once | ORAL | Status: DC

## 2022-10-26 MED ORDER — OXYCODONE-ACETAMINOPHEN 5-325 MG PO TABS
1.0000 | ORAL_TABLET | Freq: Once | ORAL | Status: DC
  Filled 2022-10-26: qty 1

## 2022-10-26 NOTE — ED Triage Notes (Signed)
Pt was just seen for left foot pain and has current cyst that needs removal. Pt left room to go outside against medical advice and was d/c off of board. Pt requesting to check back in. Pt requesting pain medication. This RN made pt aware pain medication was ordered but pt left prior to administration. Pt ambulatory to triage desk.

## 2022-10-26 NOTE — ED Provider Notes (Signed)
[facility] Provider Note    Event Date/Time   First MD Initiated Contact with Patient 10/26/22 1133     (approximate)   History   Chief Complaint Foot Pain   HPI  Corey Cortez is a 56 y.o. male with past medical history of hypertension, COPD, polysubstance abuse, and alcohol abuse who presents to the ED complaining of foot pain.  Patient reports that he has been dealing with pain around his left big toe for 20 years.  He states that head has been bothering him more lately and he would like to have surgery to have it fixed.  He states that he was previously scheduled for surgery at [facility] but that he had transportation issues and therefore missed his appointment.  He has continued to have pain since then, not alleviated by Tylenol or ibuprofen.  He is requesting to have surgery today or to be transferred to [facility] for this problem.  He was seen for this problem in the ED earlier today, eloped from the emergency department prior to completion of his workup.     Physical Exam   Triage Vital Signs: ED Triage Vitals  Enc Vitals Group     BP 10/26/22 1040 130/86     Pulse Rate 10/26/22 1040 80     Resp 10/26/22 1040 18     Temp 10/26/22 1040 98.2 F (36.8 C)     Temp Source 10/26/22 1040 Oral     SpO2 10/26/22 1040 96 %     Weight 10/26/22 1200 182 lb 15.7 oz (83 kg)     Height 10/26/22 1200 5\' 9"  (1.753 m)     Head Circumference --      Peak Flow --      Pain Score 10/26/22 1032 10     Pain Loc --      Pain Edu? --      Excl. in [place]? --     Most recent vital signs: Vitals:   10/26/22 1040  BP: 130/86  Pulse: 80  Resp: 18  Temp: 98.2 F (36.8 C)  SpO2: 96%    Constitutional: Alert and oriented. Eyes: Conjunctivae are normal. Head: Atraumatic. Nose: No congestion/rhinnorhea. Mouth/Throat: Mucous membranes are moist.  Cardiovascular: Normal rate, regular rhythm. Grossly normal heart sounds.  2+ radial and DP pulses  bilaterally. Respiratory: Normal respiratory effort.  No retractions. Lungs CTAB. Gastrointestinal: Soft and nontender. No distention. Musculoskeletal: Callus at the base of the left toe with associated tenderness to palpation, no associated erythema, warmth, or edema noted. Neurologic:  Normal speech and language. No gross focal neurologic deficits are appreciated.    ED Results / Procedures / Treatments   Labs (all labs ordered are listed, but only abnormal results are displayed) Labs Reviewed - No data to display   PROCEDURES:  Critical Care performed: No  Procedures   MEDICATIONS ORDERED IN ED: Medications  acetaminophen (TYLENOL) tablet 1,000 mg (1,000 mg Oral Not Given 10/26/22 1200)     IMPRESSION / MDM / ASSESSMENT AND PLAN / ED COURSE  I reviewed the triage vital signs and the nursing notes.                              56 y.o. male with past medical history of hypertension, COPD, polysubstance abuse, and alcohol abuse who presents to the ED complaining of acute on chronic pain affecting his left great toe.  Patient's presentation is most  consistent with acute, uncomplicated illness.  Differential diagnosis includes, but is not limited to, arthritis, cellulitis, osteomyelitis, hammertoe, callus.  Patient nontoxic-appearing and in no acute distress, vital signs are unremarkable and he is neurovascularly intact to his distal left lower extremity.  He has callus at the base of the toe and this area is tender but there are no signs of infection on exam.  He was seen in the ED earlier today for this same issue, x-ray performed at that time that showed hammertoe but negative for other acute process.  Patient represents this afternoon after eloping this morning, demanding surgery or transfer.  There is not appear to be any indication for either surgical intervention or transfer at this time.  Reviewing his chart, patient was seen by podiatry at the end of October with similar  complaints, was counseled on conservative management rather than surgical intervention at that time.  Patient is upset he will not receive surgery today, was counseled to follow-up with podiatry as an outpatient.  He is requesting narcotic pain medication but I do not feel this would be indicated, was counseled on Tylenol or ibuprofen as needed.      FINAL CLINICAL IMPRESSION(S) / ED DIAGNOSES   Final diagnoses:  Chronic pain in left foot  Hammer toe of left foot     Rx / DC Orders   ED Discharge Orders     None        Note:  This document was prepared using Dragon voice recognition software and may include unintentional dictation errors.   Chesley Noon, MD 10/26/22 [phone]

## 2022-10-26 NOTE — ED Triage Notes (Signed)
Pt to ED via [facility] from home. Pt reports left foot pain and has current cyst that needs removal. Pt states he is suppose to be scheduling surgery but has not been able to get in contact with anyone. Pt is requesting to be transported to [facility]. Pt ambulatory in waiting room.   EMS VS: HR 82 99% RA 120/70

## 2022-10-26 NOTE — ED Notes (Signed)
Pt stated he needed some air outside, this RN explained to pt that we could put him on a nasal canula with O2 to make him feel more comfortable. Pt declined this offer and was instructed that if he left the ED to go outside he would have to check back in. Pt preceded to leave ED. Will remove pt off the board as AMA.

## 2022-10-26 NOTE — ED Provider Notes (Signed)
[facility] Provider Note    Event Date/Time   First MD Initiated Contact with Patient 10/26/22 0930     (approximate)   History   Foot Pain (Left)   HPI  Corey Cortez is a 56 y.o. male with history of hypertension, seizures, ascites and EtOH abuse presents emergency department complaining of left foot pain.      Physical Exam   Triage Vital Signs: ED Triage Vitals  Enc Vitals Group     BP 10/26/22 0824 136/84     Pulse Rate 10/26/22 0824 81     Resp 10/26/22 0824 18     Temp 10/26/22 0824 98.4 F (36.9 C)     Temp Source 10/26/22 0824 Oral     SpO2 10/26/22 0824 97 %     Weight 10/26/22 0808 182 lb 15.7 oz (83 kg)     Height 10/26/22 0808 5\' 9"  (1.753 m)     Head Circumference --      Peak Flow --      Pain Score 10/26/22 0808 10     Pain Loc --      Pain Edu? --      Excl. in [place]? --     Most recent vital signs: Vitals:   10/26/22 0824  BP: 136/84  Pulse: 81  Resp: 18  Temp: 98.4 F (36.9 C)  SpO2: 97%     General: Awake, no distress.   CV:  Good peripheral perfusion. regular rate and  rhythm Resp:  Normal effort.  Abd:  No distention.   Other:  Left foot noted to have some swelling more typical of a callus along plantar surface, blistered area noted on the medial aspect along the plantar surface of the great toe, area is very tender to palpation  ED Results / Procedures / Treatments   Labs (all labs ordered are listed, but only abnormal results are displayed) Labs Reviewed  CBC WITH DIFFERENTIAL/PLATELET  COMPREHENSIVE METABOLIC PANEL     EKG     RADIOLOGY X-ray of the left foot    PROCEDURES:   Procedures   MEDICATIONS ORDERED IN ED: Medications - No data to display    IMPRESSION / MDM / ASSESSMENT AND PLAN / ED COURSE  I reviewed the triage vital signs and the nursing notes.                              Differential diagnosis includes, but is not limited to, cellulitis, osteomyelitis,  chronic foot pain, plantars wart  Patient's presentation is most consistent with acute complicated illness / injury requiring diagnostic workup.   Patient has been very insistent that he be transferred to [facility] to have immediate surgery on his foot.  Explained to him that we have to do a workup to qualify him for being transferred to [facility].  He does have a podiatrist at [facility] clinic.  Told him we can confer with them to see if there is anything emergent that we can do today.  Do lab work and x-ray.  Patient is also asking for food and drinks.   Patient eloped from the exam room after his x-ray  X-rays independently reviewed interpreted by me as being negative for any acute abnormality  FINAL CLINICAL IMPRESSION(S) / ED DIAGNOSES   Final diagnoses:  Left foot pain     Rx / DC Orders   ED Discharge Orders     None  Note:  This document was prepared using Dragon voice recognition software and may include unintentional dictation errors.    Faythe Ghee, PA-C 10/26/22 1247    Chesley Noon, MD 10/26/22 [phone]

## 2022-11-09 ENCOUNTER — Emergency Department
Admission: EM | Admit: 2022-11-09 | Discharge: 2022-11-09 | Discharge status: Against Medical Advice | Payer: Medicare Other | Attending: Emergency Medicine | Admitting: Emergency Medicine

## 2022-11-09 ENCOUNTER — Other Ambulatory Visit: Payer: Self-pay

## 2022-11-09 DIAGNOSIS — F10239 Alcohol dependence with withdrawal, unspecified: Secondary | ICD-10-CM | POA: Insufficient documentation

## 2022-11-09 DIAGNOSIS — F1413 Cocaine abuse, unspecified with withdrawal: Secondary | ICD-10-CM | POA: Insufficient documentation

## 2022-11-09 DIAGNOSIS — Z5321 Procedure and treatment not carried out due to patient leaving prior to being seen by health care provider: Secondary | ICD-10-CM | POA: Diagnosis not present

## 2022-11-09 NOTE — ED Provider Notes (Signed)
Patient eloped prior to my assessment.   Duffy Bruce, MD 11/09/22 2206

## 2022-11-09 NOTE — ED Triage Notes (Signed)
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 Pt states he is looking for long term facility. Pt states last alcohol was last night and last cocaine was last night along with marijuana.  Pt eating in waiting room.

## 2022-11-09 NOTE — ED Notes (Signed)
Pt visualized leaving ED after walked to [address]. Pt in NAD
